# Patient Record
Sex: Male | Born: 1950 | Race: White | Hispanic: No | Marital: Married | Smoking: Former smoker
Health system: Southern US, Community
[De-identification: ages and names within clinical notes are randomized; demographics above are authoritative.]

## PROBLEM LIST (undated history)

## (undated) DIAGNOSIS — T783XXA Angioneurotic edema, initial encounter: Secondary | ICD-10-CM

## (undated) DIAGNOSIS — K219 Gastro-esophageal reflux disease without esophagitis: Secondary | ICD-10-CM

## (undated) DIAGNOSIS — Z86010 Personal history of colonic polyps: Secondary | ICD-10-CM

## (undated) DIAGNOSIS — S149XXA Injury of unspecified nerves of neck, initial encounter: Secondary | ICD-10-CM

## (undated) DIAGNOSIS — G459 Transient cerebral ischemic attack, unspecified: Secondary | ICD-10-CM

## (undated) DIAGNOSIS — E559 Vitamin D deficiency, unspecified: Secondary | ICD-10-CM

## (undated) DIAGNOSIS — G589 Mononeuropathy, unspecified: Secondary | ICD-10-CM

## (undated) DIAGNOSIS — I10 Essential (primary) hypertension: Secondary | ICD-10-CM

## (undated) HISTORY — DX: Angioneurotic edema, initial encounter: T78.3XXA

## (undated) HISTORY — DX: Vitamin D deficiency, unspecified: E55.9

## (undated) HISTORY — DX: Injury of unspecified nerves of neck, initial encounter: S14.9XXA

## (undated) HISTORY — DX: Personal history of colonic polyps: Z86.010

## (undated) HISTORY — PX: WISDOM TOOTH EXTRACTION: SHX21

## (undated) HISTORY — DX: Mononeuropathy, unspecified: G58.9

## (undated) HISTORY — PX: BACK SURGERY: SHX140

## (undated) HISTORY — DX: Transient cerebral ischemic attack, unspecified: G45.9

## (undated) HISTORY — PX: SPINE SURGERY: SHX786

## (undated) HISTORY — PX: TONSILLECTOMY: SUR1361

---

## 1978-03-28 HISTORY — PX: MOUTH SURGERY: SHX715

## 2002-08-10 ENCOUNTER — Emergency Department (HOSPITAL_COMMUNITY): Admission: EM | Admit: 2002-08-10 | Discharge: 2002-08-10 | Payer: Self-pay | Admitting: Emergency Medicine

## 2002-08-10 ENCOUNTER — Encounter: Payer: Self-pay | Admitting: Emergency Medicine

## 2003-09-26 DIAGNOSIS — G459 Transient cerebral ischemic attack, unspecified: Secondary | ICD-10-CM

## 2003-09-26 HISTORY — DX: Transient cerebral ischemic attack, unspecified: G45.9

## 2003-10-20 ENCOUNTER — Observation Stay (HOSPITAL_COMMUNITY): Admission: EM | Admit: 2003-10-20 | Discharge: 2003-10-21 | Payer: Self-pay

## 2010-08-31 DIAGNOSIS — Z86010 Personal history of colonic polyps: Secondary | ICD-10-CM

## 2010-08-31 HISTORY — DX: Personal history of colonic polyps: Z86.010

## 2013-11-11 ENCOUNTER — Other Ambulatory Visit: Payer: Self-pay | Admitting: Family Medicine

## 2013-11-11 DIAGNOSIS — R51 Headache: Secondary | ICD-10-CM

## 2013-11-12 ENCOUNTER — Ambulatory Visit
Admission: RE | Admit: 2013-11-12 | Discharge: 2013-11-12 | Disposition: A | Discharge status: Home / Self Care | Payer: 59 | Source: Ambulatory Visit | Attending: Family Medicine | Admitting: Family Medicine

## 2013-11-12 DIAGNOSIS — R51 Headache: Secondary | ICD-10-CM

## 2013-11-12 MED ORDER — GADOBENATE DIMEGLUMINE 529 MG/ML IV SOLN
20.0000 mL | Freq: Once | INTRAVENOUS | Status: AC | PRN
  Administered 2013-11-12: 20 mL via INTRAVENOUS

## 2015-10-28 ENCOUNTER — Emergency Department (HOSPITAL_BASED_OUTPATIENT_CLINIC_OR_DEPARTMENT_OTHER)
Admission: EM | Admit: 2015-10-28 | Discharge: 2015-10-28 | Disposition: A | Discharge status: Home / Self Care | Payer: Worker's Compensation | Attending: Emergency Medicine | Admitting: Emergency Medicine

## 2015-10-28 ENCOUNTER — Encounter (HOSPITAL_BASED_OUTPATIENT_CLINIC_OR_DEPARTMENT_OTHER): Payer: Self-pay

## 2015-10-28 DIAGNOSIS — Y9389 Activity, other specified: Secondary | ICD-10-CM | POA: Diagnosis not present

## 2015-10-28 DIAGNOSIS — S8012XA Contusion of left lower leg, initial encounter: Secondary | ICD-10-CM | POA: Diagnosis not present

## 2015-10-28 DIAGNOSIS — I10 Essential (primary) hypertension: Secondary | ICD-10-CM | POA: Insufficient documentation

## 2015-10-28 DIAGNOSIS — S80812A Abrasion, left lower leg, initial encounter: Secondary | ICD-10-CM

## 2015-10-28 DIAGNOSIS — Y990 Civilian activity done for income or pay: Secondary | ICD-10-CM | POA: Diagnosis not present

## 2015-10-28 DIAGNOSIS — Z79899 Other long term (current) drug therapy: Secondary | ICD-10-CM | POA: Insufficient documentation

## 2015-10-28 DIAGNOSIS — Z87891 Personal history of nicotine dependence: Secondary | ICD-10-CM | POA: Diagnosis not present

## 2015-10-28 DIAGNOSIS — S00511A Abrasion of lip, initial encounter: Secondary | ICD-10-CM

## 2015-10-28 DIAGNOSIS — Y929 Unspecified place or not applicable: Secondary | ICD-10-CM | POA: Insufficient documentation

## 2015-10-28 DIAGNOSIS — W228XXA Striking against or struck by other objects, initial encounter: Secondary | ICD-10-CM | POA: Insufficient documentation

## 2015-10-28 DIAGNOSIS — S0181XA Laceration without foreign body of other part of head, initial encounter: Secondary | ICD-10-CM | POA: Diagnosis present

## 2015-10-28 HISTORY — DX: Essential (primary) hypertension: I10

## 2015-10-28 HISTORY — DX: Gastro-esophageal reflux disease without esophagitis: K21.9

## 2015-10-28 NOTE — ED Notes (Signed)
Pt verbalizes understanding of d/c instructions and denies any further need at this time. 

## 2015-10-28 NOTE — ED Triage Notes (Addendum)
Pt was trying to get into a truck that was rolling backward and hit his head on the door.  He has a 1 inch, vertical, jagged laceration medial to left eyebrow.  No LOC, pt is not on blood thinners.  Pt also thinks he pulled his right hamstring during episode.

## 2015-10-28 NOTE — ED Provider Notes (Addendum)
[facility]   CSN: [id number] Arrival date & time: 10/28/15  0355  First Provider Contact:  First MD Initiated Contact with Patient 10/28/15 [phone]        History   Chief Complaint Chief Complaint  Patient presents with  . Laceration    HPI XAIVIER Cooper is a 65 y.o. male who struck his head on a door at work this morning just PTA. He was attempting to stop a truck that was rolling backwards. He has a laceration to his mid forehead just to the left of midline. There was no loss of consciousness. Bleeding is been controlled with pressure. There is minimal associated pain. He also has a small abrasion to his upper lip as well as some mild pain in his right posterior thigh, worse with ambulation. He also has a tender, swollen abraded injury to his left shin which he did not notice initially. He states his tetanus is up-to-date.  HPI  Past Medical History:  Diagnosis Date  . GERD (gastroesophageal reflux disease)   . Hypertension     There are no active problems to display for this patient.   Past Surgical History:  Procedure Laterality Date  . BACK SURGERY  2000, 1995     Home Medications    Prior to Admission medications   Medication Sig Start Date End Date Taking? Authorizing Provider  lisinopril (PRINIVIL,ZESTRIL) 40 MG tablet Take 40 mg by mouth daily.   Yes Historical Provider, MD  omeprazole (PRILOSEC) 20 MG capsule Take 20 mg by mouth daily.   Yes Historical Provider, MD    Family History No family history on file.  Social History Social History  Substance Use Topics  . Smoking status: Former Games developer  . Smokeless tobacco: Never Used  . Alcohol use Not on file     Allergies   Codeine   Review of Systems Review of Systems  All other systems reviewed and are negative.   Physical Exam Updated Vital Signs BP (!) 161/101 (BP Location: Left Arm)   Pulse 95   Temp 98.2 F (36.8 C) (Oral)   Resp 18   Ht 6' (1.829 m)   Wt 220  lb (99.8 kg)   SpO2 98%   BMI 29.84 kg/m   Physical Exam General: Well-developed, well-nourished male in no acute distress; appearance consistent with age of record HENT: normocephalic; vertical, somewhat jagged laceration mid forehead superficial abrasion to upper lip; TMs obscured by cerumen bilaterally Eyes: pupils equal, round and reactive to light; extraocular muscles intact Neck: supple; C-spine nontender Heart: regular rate and rhythm Lungs: clear to auscultation bilaterally Abdomen: soft; nondistended; nontender; no masses or hepatosplenomegaly; bowel sounds present Extremities: No deformity; full range of motion; mild tenderness right posterior thigh; hematoma and abrasion to left shin Neurologic: Awake, alert and oriented; motor function intact in all extremities and symmetric; no facial droop Skin: Warm and dry Psychiatric: Normal mood and affect    ED Treatments / Results    Procedures (including critical care time)  LACERATION REPAIR Performed by: , L Authorized by: Hanley Seamen Consent: Verbal consent obtained. Risks and benefits: risks, benefits and alternatives were discussed Consent given by: patient Patient identity confirmed: provided demographic data Prepped and Draped in normal sterile fashion Wound explored  Laceration Location: Mid forehead  Laceration Length: 2.5 cm  No Foreign Bodies seen or palpated  Anesthesia: None   Irrigation method: syringe Amount of cleaning: standard  Skin closure: Skin adhesive   Patient tolerance:  Patient tolerated the procedure well with no immediate complications.   Final Clinical Impressions(s) / ED Diagnoses   Final diagnoses:  Facial laceration, initial encounter  Abrasion of vermilion border of upper lip, initial encounter  Abrasion of anterior left lower leg, initial encounter  Traumatic hematoma of left lower leg, initial encounter      Paula Libra, MD 10/28/15 0423    Paula Libra,  MD 10/28/15 0425    Paula Libra, MD 10/28/15 [phone]

## 2016-07-12 DIAGNOSIS — E559 Vitamin D deficiency, unspecified: Secondary | ICD-10-CM | POA: Diagnosis not present

## 2016-07-12 DIAGNOSIS — S4992XD Unspecified injury of left shoulder and upper arm, subsequent encounter: Secondary | ICD-10-CM | POA: Diagnosis not present

## 2016-07-12 DIAGNOSIS — F419 Anxiety disorder, unspecified: Secondary | ICD-10-CM | POA: Diagnosis not present

## 2016-07-12 DIAGNOSIS — E782 Mixed hyperlipidemia: Secondary | ICD-10-CM | POA: Diagnosis not present

## 2016-07-12 DIAGNOSIS — I10 Essential (primary) hypertension: Secondary | ICD-10-CM | POA: Diagnosis not present

## 2016-07-12 DIAGNOSIS — K219 Gastro-esophageal reflux disease without esophagitis: Secondary | ICD-10-CM | POA: Diagnosis not present

## 2016-08-04 DIAGNOSIS — I10 Essential (primary) hypertension: Secondary | ICD-10-CM | POA: Diagnosis not present

## 2016-08-04 DIAGNOSIS — J209 Acute bronchitis, unspecified: Secondary | ICD-10-CM | POA: Diagnosis not present

## 2016-08-07 ENCOUNTER — Encounter (HOSPITAL_COMMUNITY): Payer: Self-pay | Admitting: Emergency Medicine

## 2016-08-07 ENCOUNTER — Emergency Department (HOSPITAL_COMMUNITY): Payer: PPO

## 2016-08-07 ENCOUNTER — Emergency Department (HOSPITAL_COMMUNITY)
Admission: EM | Admit: 2016-08-07 | Discharge: 2016-08-07 | Disposition: A | Discharge status: Home / Self Care | Payer: PPO | Attending: Emergency Medicine | Admitting: Emergency Medicine

## 2016-08-07 DIAGNOSIS — R918 Other nonspecific abnormal finding of lung field: Secondary | ICD-10-CM | POA: Diagnosis not present

## 2016-08-07 DIAGNOSIS — R1030 Lower abdominal pain, unspecified: Secondary | ICD-10-CM | POA: Insufficient documentation

## 2016-08-07 DIAGNOSIS — R3915 Urgency of urination: Secondary | ICD-10-CM | POA: Diagnosis not present

## 2016-08-07 DIAGNOSIS — R109 Unspecified abdominal pain: Secondary | ICD-10-CM | POA: Diagnosis not present

## 2016-08-07 DIAGNOSIS — Z79899 Other long term (current) drug therapy: Secondary | ICD-10-CM | POA: Diagnosis not present

## 2016-08-07 DIAGNOSIS — Z87891 Personal history of nicotine dependence: Secondary | ICD-10-CM | POA: Diagnosis not present

## 2016-08-07 DIAGNOSIS — I10 Essential (primary) hypertension: Secondary | ICD-10-CM | POA: Diagnosis not present

## 2016-08-07 DIAGNOSIS — R350 Frequency of micturition: Secondary | ICD-10-CM | POA: Insufficient documentation

## 2016-08-07 DIAGNOSIS — R300 Dysuria: Secondary | ICD-10-CM

## 2016-08-07 DIAGNOSIS — R911 Solitary pulmonary nodule: Secondary | ICD-10-CM | POA: Diagnosis not present

## 2016-08-07 LAB — CBC WITH DIFFERENTIAL/PLATELET
BASOS PCT: 0 %
Basophils Absolute: 0 10*3/uL (ref 0.0–0.1)
EOS PCT: 5 %
Eosinophils Absolute: 0.6 10*3/uL (ref 0.0–0.7)
HEMATOCRIT: 42.4 % (ref 39.0–52.0)
Hemoglobin: 14.9 g/dL (ref 13.0–17.0)
Lymphocytes Relative: 14 %
Lymphs Abs: 1.7 10*3/uL (ref 0.7–4.0)
MCH: 31.7 pg (ref 26.0–34.0)
MCHC: 35.1 g/dL (ref 30.0–36.0)
MCV: 90.2 fL (ref 78.0–100.0)
Monocytes Absolute: 1.8 10*3/uL — ABNORMAL HIGH (ref 0.1–1.0)
Monocytes Relative: 16 %
NEUTROS ABS: 7.7 10*3/uL (ref 1.7–7.7)
NEUTROS PCT: 65 %
PLATELETS: 272 10*3/uL (ref 150–400)
RBC: 4.7 MIL/uL (ref 4.22–5.81)
RDW: 12.7 % (ref 11.5–15.5)
WBC: 11.7 10*3/uL — AB (ref 4.0–10.5)

## 2016-08-07 LAB — URINALYSIS, ROUTINE W REFLEX MICROSCOPIC
Bilirubin Urine: NEGATIVE
Glucose, UA: NEGATIVE mg/dL
Hgb urine dipstick: NEGATIVE
KETONES UR: NEGATIVE mg/dL
NITRITE: NEGATIVE
PH: 6 (ref 5.0–8.0)
Protein, ur: NEGATIVE mg/dL
SQUAMOUS EPITHELIAL / LPF: NONE SEEN
Specific Gravity, Urine: 1.009 (ref 1.005–1.030)

## 2016-08-07 LAB — BASIC METABOLIC PANEL
ANION GAP: 10 (ref 5–15)
BUN: 24 mg/dL — ABNORMAL HIGH (ref 6–20)
CALCIUM: 9 mg/dL (ref 8.9–10.3)
CO2: 22 mmol/L (ref 22–32)
CREATININE: 1.06 mg/dL (ref 0.61–1.24)
Chloride: 104 mmol/L (ref 101–111)
Glucose, Bld: 99 mg/dL (ref 65–99)
Potassium: 3.7 mmol/L (ref 3.5–5.1)
SODIUM: 136 mmol/L (ref 135–145)

## 2016-08-07 MED ORDER — ONDANSETRON HCL 4 MG/2ML IJ SOLN
4.0000 mg | Freq: Once | INTRAMUSCULAR | Status: AC
  Administered 2016-08-07: 4 mg via INTRAVENOUS
  Filled 2016-08-07: qty 2

## 2016-08-07 MED ORDER — PHENAZOPYRIDINE HCL 200 MG PO TABS: 200.0000 mg | ORAL_TABLET | Freq: Three times a day (TID) | ORAL | 0 refills | Status: DC | PRN

## 2016-08-07 MED ORDER — CIPROFLOXACIN HCL 500 MG PO TABS: 500.0000 mg | ORAL_TABLET | Freq: Two times a day (BID) | ORAL | 0 refills | Status: DC

## 2016-08-07 MED ORDER — DIAZEPAM 5 MG PO TABS: 5.0000 mg | ORAL_TABLET | Freq: Two times a day (BID) | ORAL | 0 refills | Status: DC

## 2016-08-07 MED ORDER — DIAZEPAM 5 MG/ML IJ SOLN
5.0000 mg | Freq: Once | INTRAMUSCULAR | Status: AC
  Administered 2016-08-07: 5 mg via INTRAVENOUS
  Filled 2016-08-07: qty 2

## 2016-08-07 MED ORDER — DEXTROSE 5 % IV SOLN
1.0000 g | Freq: Once | INTRAVENOUS | Status: AC
  Administered 2016-08-07: 1 g via INTRAVENOUS
  Filled 2016-08-07: qty 10

## 2016-08-07 MED ORDER — MORPHINE SULFATE (PF) 4 MG/ML IV SOLN
4.0000 mg | Freq: Once | INTRAVENOUS | Status: AC
  Administered 2016-08-07: 4 mg via INTRAVENOUS
  Filled 2016-08-07: qty 1

## 2016-08-07 MED ORDER — SODIUM CHLORIDE 0.9 % IV BOLUS (SEPSIS)
1000.0000 mL | Freq: Once | INTRAVENOUS | Status: AC
  Administered 2016-08-07: 1000 mL via INTRAVENOUS

## 2016-08-07 NOTE — ED Provider Notes (Signed)
[facility]-EMER[facility]ENCY DEPT Provider Note   CSN: [id number] Arrival date & time: 08/07/16  1256     History   Chief Complaint Chief Complaint  Patient presents with  . Urinary Retention    HPI Craig Cooper is a 66 y.o. male.  HPI 66 year old Caucasian male past medical history significant for GERD and hypertension presents to the emergency Department today with complaints of bladder pressure, urgency, frequency, urinary retention for the past 24 hours. The patient states that his last normal void was 24 hours ago. He only dribbles now. He complains of bladder pressure and pain at the end of his penis when urinating. States that there is urgency and frequency. Patient is concerned that he started taking Zithromax and is concerned for a side effect of the drug. Patient spoke with his primary care doctor today who sent him to the emergency department for evaluation. Patient states he started taking Zithromax 4 days ago for bronchitis. Patient states she did have a fever yesterday that was treated with Tylenol and has not had a fever since. Denies any history of the same pain. Patient denies any flank pain. Denies any hematuria. Reports mild nausea and emesis that he relates to taking the Zithromax. Denies any chills, headache, chest pain, shortness of breath, change in bowel habits. Past Medical History:  Diagnosis Date  . GERD (gastroesophageal reflux disease)   . Hypertension     There are no active problems to display for this patient.   Past Surgical History:  Procedure Laterality Date  . BACK SURGERY  2000, 1995  . TONSILLECTOMY    . WISDOM TOOTH EXTRACTION         Home Medications    Prior to Admission medications   Medication Sig Start Date End Date Taking? Authorizing Provider  lisinopril (PRINIVIL,ZESTRIL) 40 MG tablet Take 40 mg by mouth daily.    [provider]  omeprazole (PRILOSEC) 20 MG capsule Take 20 mg by mouth daily.    [provider]     Family History Family History  Problem Relation Age of Onset  . Hypertension Mother   . Hypertension Father     Social History Social History  Substance Use Topics  . Smoking status: Former Games developer  . Smokeless tobacco: Never Used  . Alcohol use Yes     Comment: occ     Allergies   Codeine   Review of Systems Review of Systems  Constitutional: Positive for fever (resolved yesterday). Negative for chills.  Eyes: Negative for visual disturbance.  Respiratory: Negative for cough and shortness of breath.   Cardiovascular: Negative for chest pain.  Gastrointestinal: Positive for abdominal pain (suprapubic), nausea and vomiting. Negative for diarrhea.  Genitourinary: Positive for dysuria, frequency, penile pain and urgency. Negative for discharge, flank pain, hematuria, scrotal swelling and testicular pain.  Skin: Negative.   Neurological: Negative for dizziness, syncope, weakness, light-headedness and headaches.     Physical Exam Updated Vital Signs BP 131/80   Pulse 70   Temp 97.6 F (36.4 C) (Oral)   Resp 16   Wt 97.5 kg   SpO2 100%   BMI 29.16 kg/m   Physical Exam  Constitutional: He is oriented to person, place, and time. He appears well-developed and well-nourished. No distress.  The patient is pacing in the room and appears uncomfortable due to pain.  HENT:  Head: Normocephalic and atraumatic.  Mouth/Throat: Oropharynx is clear and moist.  Eyes: Conjunctivae are normal. Right eye exhibits no discharge. Left eye exhibits  no discharge. No scleral icterus.  Neck: Normal range of motion. Neck supple. No thyromegaly present.  Cardiovascular: Normal rate, regular rhythm, normal heart sounds and intact distal pulses.   Pulmonary/Chest: Effort normal and breath sounds normal.  Abdominal: Soft. Bowel sounds are normal. He exhibits no distension. There is tenderness in the suprapubic area. There is no rigidity, no rebound, no guarding and no CVA tenderness.   Genitourinary: Testes normal. Right testis shows no mass, no swelling and no tenderness. Left testis shows no mass, no swelling and no tenderness. Circumcised. No penile erythema or penile tenderness. No discharge found.  Musculoskeletal: Normal range of motion.  Lymphadenopathy:    He has no cervical adenopathy.  Neurological: He is alert and oriented to person, place, and time.  Skin: Skin is warm and dry. Capillary refill takes less than 2 seconds.  Nursing note and vitals reviewed.    ED Treatments / Results  Labs (all labs ordered are listed, but only abnormal results are displayed) Labs Reviewed  URINALYSIS, ROUTINE W REFLEX MICROSCOPIC - Abnormal; Notable for the following:       Result Value   Leukocytes, UA MODERATE (*)    Bacteria, UA FEW (*)    All other components within normal limits  BASIC METABOLIC PANEL - Abnormal; Notable for the following:    BUN 24 (*)    All other components within normal limits  CBC WITH DIFFERENTIAL/PLATELET - Abnormal; Notable for the following:    WBC 11.7 (*)    Monocytes Absolute 1.8 (*)    All other components within normal limits  URINE CULTURE    EKG  EKG Interpretation None       Radiology Ct Renal Stone Study  Result Date: 08/07/2016 CLINICAL DATA:  66 year old male with abdominal and pelvic pain with bladder pressure for 1 day. EXAM: CT ABDOMEN AND PELVIS WITHOUT CONTRAST TECHNIQUE: Multidetector CT imaging of the abdomen and pelvis was performed following the standard protocol without IV contrast. COMPARISON:  04/11/2014 lumbar spine MR FINDINGS: Please note that parenchymal abnormalities may be missed without intravenous contrast. Lower chest: No acute abnormality. A 3 mm right middle lobe nodule (image 5) and a 3 mm left lower lobe nodule (image 16) are noted. Hepatobiliary: The liver and gallbladder are unremarkable except for hepatic cysts. There is no evidence of biliary dilatation. Pancreas: Unremarkable Spleen:  Unremarkable Adrenals/Urinary Tract: Fullness of the left intrarenal collecting system and mild perinephric stranding are unchanged. There is no evidence of hydronephrosis or urinary calculi. A 1.5 x 2 cm right adrenal adenoma is noted. The left adrenal gland and bladder are unremarkable. Stomach/Bowel: Sigmoid colonic diverticulosis noted without evidence of diverticulitis. There is no evidence of bowel wall thickening or bowel obstruction. Vascular/Lymphatic: Aortic atherosclerotic calcifications noted without aneurysm. No enlarged lymph nodes identified. Reproductive: The seminal vesicles are prominent with mild adjacent stranding of uncertain chronicity. Prostate is within normal limits. Other: Moderate bilateral inguinal hernias containing fat are noted. There is no evidence of ascites, pneumoperitoneum or focal collection. Musculoskeletal: No acute abnormality or suspicious focal lesions. Interbody fusion cages at L4-L5 noted. Moderate degenerative disc disease, spondylosis and facet arthropathy within the remainder of the lumbar spine identified. IMPRESSION: Prominent seminal vesicles with mild adjacent stranding of uncertain chronicity. This may represent seminal vesiculitis -correlate clinically. No evidence of obstructing urinary calculi or hydronephrosis. Fullness of the intrarenal collecting systems appear unchanged from 2016. Abdominal aortic atherosclerosis. Moderate bilateral inguinal hernias containing fat. 2 cm right adrenal adenoma. 3 mm right  middle lobe and 3 mm left lower lobe pulmonary nodules. No follow-up needed if patient is low-risk (and has no known or suspected primary neoplasm). Non-contrast chest CT can be considered in 12 months if patient is high-risk. This recommendation follows the consensus statement: Guidelines for Management of Incidental Pulmonary Nodules Detected on CT Images: From the Fleischner Society 2017; Radiology 2017; 284:228-243. Electronically Signed   By: Harmon PierJeffrey  Hu  M.D.   On: 08/07/2016 15:31    Procedures Procedures (including critical care time)  Medications Ordered in ED Medications  morphine 4 MG/ML injection 4 mg (4 mg Intravenous Given 08/07/16 1458)  ondansetron (ZOFRAN) injection 4 mg (4 mg Intravenous Given 08/07/16 1458)  cefTRIAXone (ROCEPHIN) 1 g in dextrose 5 % 50 mL IVPB (0 g Intravenous Stopped 08/07/16 1527)  sodium chloride 0.9 % bolus 1,000 mL (0 mLs Intravenous Stopped 08/07/16 1637)  diazepam (VALIUM) injection 5 mg (5 mg Intravenous Given 08/07/16 1527)     Initial Impression / Assessment and Plan / ED Course  I have reviewed the triage vital signs and the nursing notes.  Pertinent labs & imaging results that were available during my care of the patient were reviewed by me and considered in my medical decision making (see chart for details).     Patient presents to the emergency Department with complaints of urinary urgency, frequency, dysuria, bladder pressure. The patient has had little urine output over the past 24 hours. Initially started on Zithromax for bronchitis. Bladder scan shows 50 ML's of urine in the bladder doubt acute urinary retention. UA shows moderate leukocytes, too numerous to count WBCs and few bacteria. Has a mild leukocytosis of 11,000. Patient is afebrile. Heart rate is normal and no hypotension. Patient does not meet SIRS or Sepsis criteria.  mild tenderness of the suprapubic region. The creatinine is normal. Electrolytes are normal. Given patient's tenderness with UA and signs of infection will order CT scan to rule out stone. CT scan does show Prominent seminal vesicles with mild adjacent stranding of uncertain chronicity, this may represent seminal vesiculitis. Prostate appears to be normal. Bladder is nondistended or inflamed. Unsure if patient's symptoms are coming from seminal vesiculitis, prostatitis, cystitis. We'll send urine for culture. Will start patient on antibiotics. Was given IV Rocephin in the ED.  We'll discharge with 14 days of Cipro at home. Given Pyridium and Valium for bladder spasms. Patient will need close follow-up with urology. Patient was seen and evaluated by Dr.  Silverio LayYao who is agreeable to above plan and no further recommendations time. Patient was updated on CT findings and given strict return precautions. Pt is hemodynamically stable, in NAD, & able to ambulate in the ED. Pain has been managed & has no complaints prior to dc. Pt is comfortable with above plan and is stable for discharge at this time. All questions were answered prior to disposition. Strict return precautions for f/u to the ED were discussed.   Final Clinical Impressions(s) / ED Diagnoses   Final diagnoses:  Pulmonary nodule  Urinary urgency  Urinary frequency  Dysuria    New Prescriptions Discharge Medication List as of 08/07/2016  4:07 PM    START taking these medications   Details  ciprofloxacin (CIPRO) 500 MG tablet Take 1 tablet (500 mg total) by mouth 2 (two) times daily., Starting Sun 08/07/2016, Print    diazepam (VALIUM) 5 MG tablet Take 1 tablet (5 mg total) by mouth 2 (two) times daily., Starting Sun 08/07/2016, Print    phenazopyridine (PYRIDIUM)  200 MG tablet Take 1 tablet (200 mg total) by mouth 3 (three) times daily as needed for pain. Do not take for more than 2 days while taking antibiotic., Starting Sun 08/07/2016, Print         Rise Mu, PA-C 08/07/16 2301    Charlynne Pander, MD 08/09/16 [phone]

## 2016-08-07 NOTE — Discharge Instructions (Signed)
Have discussed her CT findings with view. There are this is a infection in her urine. Please have antibiotics as prescribed for 14 days. May use the Pyridium as needed for no more than 2 days while taking antibiotic. Take the Valium as needed for muscle relaxation. Warm soaks in Epsom salt. Make sure to follow up withurology. Return to the ED if you develop worsening pain, fevers, worsening vomiting or for any reason.

## 2016-08-07 NOTE — ED Triage Notes (Signed)
Pt reports bladder pressure and pain x 24 hours.Last normal void over 24 hours ago. Referred to ED by PCP. Pt. has been taking Zithromax and is concerned about urinary retention being being a side effect. Current void 100 cc clear yellow urine. Pt alert, oriented and ambulatory. Family at bedside

## 2016-08-10 DIAGNOSIS — N401 Enlarged prostate with lower urinary tract symptoms: Secondary | ICD-10-CM | POA: Diagnosis not present

## 2016-08-10 DIAGNOSIS — N410 Acute prostatitis: Secondary | ICD-10-CM | POA: Diagnosis not present

## 2016-09-21 DIAGNOSIS — N401 Enlarged prostate with lower urinary tract symptoms: Secondary | ICD-10-CM | POA: Diagnosis not present

## 2016-09-30 ENCOUNTER — Other Ambulatory Visit: Payer: Self-pay | Admitting: *Deleted

## 2016-09-30 NOTE — Patient Outreach (Addendum)
[facility] Care Management  09/30/2016  Craig Cooper 04/23/1950 [id number]   Member identified as high risk according to [employer] health questionnaire.  Call placed to introduce [facility] services and perform telephone screening.  No answer, HIPAA compliant voice message left.  Will await call back, if no call back, will follow up within the next week.    Update @ 1315:  Call received back from member.  Identity verified.  [facility] explained.  He report that he is independent in managing his care, confirms that he attends office visits with his primary MD on a regular basis.  Report his hypertension medication was changed from Lisinopril due to side effects, but is now controlled with new medication.  He denies any concern regarding affordability of meds, but he does express interest about obtaining a new blood pressure monitor.  He state he has one that is several years old and need to be updated.  He is advised that this care manager will look into resources for new monitor and will follow up next week.  He is open to receive information regarding [facility] services for future purposes, declines involvement at this time.   Kemper DurieMonica , [place]RN, MSN [facility] Care [facility]  [facility] Care Manager [phone]

## 2016-10-03 ENCOUNTER — Other Ambulatory Visit: Payer: Self-pay | Admitting: *Deleted

## 2016-10-03 NOTE — Patient Outreach (Signed)
[facility] Care Management  10/03/2016  Lorra HalsVirgil Wayne Nehme 02/28/1951 [id number]   Discussed member's concern of needing updated blood pressure monitor with assistant clinical director, permission granted to provide new monitor.  Call placed to member to inform him that [facility] and mail new blood pressure monitor to him at home.  He expresses gratitude.  Will not open or place referral at this time as member has no further concerns.  Kemper DurieMonica , [place]RN, MSN [facility] Care Management  [facility] Care Manager [phone]

## 2016-10-06 ENCOUNTER — Other Ambulatory Visit: Payer: Self-pay | Admitting: *Deleted

## 2016-10-21 DIAGNOSIS — S86011A Strain of right Achilles tendon, initial encounter: Secondary | ICD-10-CM | POA: Diagnosis not present

## 2016-12-13 DIAGNOSIS — Z23 Encounter for immunization: Secondary | ICD-10-CM | POA: Diagnosis not present

## 2016-12-14 DIAGNOSIS — N401 Enlarged prostate with lower urinary tract symptoms: Secondary | ICD-10-CM | POA: Diagnosis not present

## 2016-12-14 DIAGNOSIS — R3912 Poor urinary stream: Secondary | ICD-10-CM | POA: Diagnosis not present

## 2017-01-23 DIAGNOSIS — I10 Essential (primary) hypertension: Secondary | ICD-10-CM | POA: Diagnosis not present

## 2017-01-23 DIAGNOSIS — E782 Mixed hyperlipidemia: Secondary | ICD-10-CM | POA: Diagnosis not present

## 2017-01-23 DIAGNOSIS — Z79899 Other long term (current) drug therapy: Secondary | ICD-10-CM | POA: Diagnosis not present

## 2017-01-23 DIAGNOSIS — M158 Other polyosteoarthritis: Secondary | ICD-10-CM | POA: Diagnosis not present

## 2017-01-23 DIAGNOSIS — E559 Vitamin D deficiency, unspecified: Secondary | ICD-10-CM | POA: Diagnosis not present

## 2017-01-23 DIAGNOSIS — Z0000 Encounter for general adult medical examination without abnormal findings: Secondary | ICD-10-CM | POA: Diagnosis not present

## 2017-01-23 DIAGNOSIS — Z1159 Encounter for screening for other viral diseases: Secondary | ICD-10-CM | POA: Diagnosis not present

## 2017-01-23 DIAGNOSIS — K219 Gastro-esophageal reflux disease without esophagitis: Secondary | ICD-10-CM | POA: Diagnosis not present

## 2017-01-23 DIAGNOSIS — N400 Enlarged prostate without lower urinary tract symptoms: Secondary | ICD-10-CM | POA: Diagnosis not present

## 2017-05-12 DIAGNOSIS — Z23 Encounter for immunization: Secondary | ICD-10-CM | POA: Diagnosis not present

## 2017-06-28 DIAGNOSIS — H16223 Keratoconjunctivitis sicca, not specified as Sjogren's, bilateral: Secondary | ICD-10-CM | POA: Diagnosis not present

## 2017-07-31 DIAGNOSIS — K219 Gastro-esophageal reflux disease without esophagitis: Secondary | ICD-10-CM | POA: Diagnosis not present

## 2017-07-31 DIAGNOSIS — E559 Vitamin D deficiency, unspecified: Secondary | ICD-10-CM | POA: Diagnosis not present

## 2017-07-31 DIAGNOSIS — Z6832 Body mass index (BMI) 32.0-32.9, adult: Secondary | ICD-10-CM | POA: Diagnosis not present

## 2017-07-31 DIAGNOSIS — I10 Essential (primary) hypertension: Secondary | ICD-10-CM | POA: Diagnosis not present

## 2017-07-31 DIAGNOSIS — N400 Enlarged prostate without lower urinary tract symptoms: Secondary | ICD-10-CM | POA: Diagnosis not present

## 2017-07-31 DIAGNOSIS — F419 Anxiety disorder, unspecified: Secondary | ICD-10-CM | POA: Diagnosis not present

## 2017-07-31 DIAGNOSIS — M158 Other polyosteoarthritis: Secondary | ICD-10-CM | POA: Diagnosis not present

## 2017-07-31 DIAGNOSIS — E782 Mixed hyperlipidemia: Secondary | ICD-10-CM | POA: Diagnosis not present

## 2017-08-11 DIAGNOSIS — I10 Essential (primary) hypertension: Secondary | ICD-10-CM | POA: Diagnosis not present

## 2017-08-11 DIAGNOSIS — T783XXA Angioneurotic edema, initial encounter: Secondary | ICD-10-CM | POA: Diagnosis not present

## 2017-08-18 DIAGNOSIS — I10 Essential (primary) hypertension: Secondary | ICD-10-CM | POA: Diagnosis not present

## 2017-08-18 DIAGNOSIS — T783XXD Angioneurotic edema, subsequent encounter: Secondary | ICD-10-CM | POA: Diagnosis not present

## 2017-09-18 DIAGNOSIS — K219 Gastro-esophageal reflux disease without esophagitis: Secondary | ICD-10-CM | POA: Diagnosis not present

## 2017-09-18 DIAGNOSIS — I10 Essential (primary) hypertension: Secondary | ICD-10-CM | POA: Diagnosis not present

## 2017-09-18 DIAGNOSIS — T783XXD Angioneurotic edema, subsequent encounter: Secondary | ICD-10-CM | POA: Diagnosis not present

## 2017-10-13 ENCOUNTER — Ambulatory Visit: Payer: Self-pay | Admitting: Allergy and Immunology

## 2017-10-24 ENCOUNTER — Ambulatory Visit: Payer: PPO | Admitting: Allergy and Immunology

## 2017-10-24 ENCOUNTER — Encounter: Payer: Self-pay | Admitting: Allergy and Immunology

## 2017-10-24 VITALS — BP 122/88 | HR 61 | Temp 98.1°F | Resp 20 | Ht 71.06 in | Wt 230.4 lb

## 2017-10-24 DIAGNOSIS — T783XXA Angioneurotic edema, initial encounter: Secondary | ICD-10-CM | POA: Insufficient documentation

## 2017-10-24 DIAGNOSIS — J310 Chronic rhinitis: Secondary | ICD-10-CM

## 2017-10-24 DIAGNOSIS — T7840XD Allergy, unspecified, subsequent encounter: Secondary | ICD-10-CM

## 2017-10-24 DIAGNOSIS — T783XXD Angioneurotic edema, subsequent encounter: Secondary | ICD-10-CM

## 2017-10-24 MED ORDER — FLUTICASONE PROPIONATE 50 MCG/ACT NA SUSP: NASAL | 5 refills | Status: DC

## 2017-10-24 NOTE — Progress Notes (Addendum)
New Patient Note  RE: Craig Cooper MRN: [id number] DOB: 1950/05/14 Date of Office Visit: 10/24/2017  Referring provider: Gweneth Dimitri, MD Primary care provider: Gweneth Dimitri, MD  Chief Complaint: Angioedema   History of present illness: Craig Cooper is a 67 y.o. male seen today in consultation requested by Gweneth Dimitri, MD.  He complains of episodes of swelling of the lips, tongue, and buccal mucosa.  In mid May 2019 he developed swelling of the tongue.  He reports that losartan was stopped at that time.  He had previously been on lisinopril, however discontinued this medication several months prior due to a persistent cough which improved after discontinuing the ACE inhibitor.  On June 20 he once again developed swelling of the tongue and on June 24 metoprolol and tamsulosin were discontinued.  However, since that time he has had several episodes of swelling, few times there was mild swelling of the buccal mucosa, on July 18 he had mild swelling of the upper lip and 4 days ago he once again had mild swelling of the upper lip.  He has no family history of angioedema.  He does have a history of tick bites and consumes mammalian meat.  He experiences mild nasal congestion, rhinorrhea, and sneezing, particularly with pollen exposure.  These nasal symptoms are controlled with cetirizine.  Assessment and plan: Angioedema Unclear etiology.  Food allergen skin testing was negative today despite a positive histamine control.  The patient is not taking an ACE inhibitor.  There are no concomitant symptoms concerning for anaphylaxis or constitutional symptoms worrisome for an underlying malignancy.  Will order labs to rule out hereditary angioedema, acquired angioedema, urticaria associated angioedema, and other potential etiologies.   The following labs have been ordered:  tryptase, C4, C1 esterase inhibitor (quantitative and functional), C1q, CBC, CMP, and galactose-alpha-1,3-galactose  IgE level.  The patient will be notified with further recommendations after lab results have returned.  IShould symptoms recur, a  journal is to be kept recording any foods eaten, beverages consumed, medications taken, activities performed, and environmental conditions within a 6 hour period prior to the onset of symptoms. For any symptoms concerning for anaphylaxis, 911 is to be called immediately.  Rhinitis Environmental skin tests were negative to seasonal and perennial aeroallergens.  A prescription has been provided for fluticasone nasal spray, one spray per nostril 1-2 times daily as needed. Proper nasal spray technique has been discussed and demonstrated.  Nasal saline spray (i.e., Simply Saline) or nasal saline lavage (i.e., NeilMed) is recommended as needed and prior to medicated nasal sprays.   Meds ordered this encounter  Medications  . fluticasone (FLONASE) 50 MCG/ACT nasal spray    Sig: 1 spray per nostril 1-2 times a day    Dispense:  16 g    Refill:  5    Diagnostics: Environmental skin testing: Negative despite a positive histamine control. Food allergen skin testing: Negative despite a positive histamine control.    Physical examination: Blood pressure 122/88, pulse 61, temperature 98.1 F (36.7 C), temperature source Oral, resp. rate 20, height 5' 11.06" (1.805 m), weight 230 lb 6.4 oz (104.5 kg), SpO2 97 %.  General: Alert, interactive, in no acute distress. HEENT: TMs pearly gray, turbinates moderately edematous without discharge, post-pharynx unremarkable. Neck: Supple without lymphadenopathy. Lungs: Clear to auscultation without wheezing, rhonchi or rales. CV: Normal S1, S2 without murmurs. Abdomen: Nondistended, nontender. Skin: Warm and dry, without lesions or rashes. Extremities:  No clubbing, cyanosis or edema. Neuro:  Grossly intact.  Review of systems:  Review of systems negative except as noted in HPI / PMHx or noted below: Review of Systems    Constitutional: Negative.   HENT: Negative.   Eyes: Negative.   Respiratory: Negative.   Cardiovascular: Negative.   Gastrointestinal: Negative.   Genitourinary: Negative.   Musculoskeletal: Negative.   Skin: Negative.   Neurological: Negative.   Endo/Heme/Allergies: Negative.   Psychiatric/Behavioral: Negative.     Past medical history:  Past Medical History:  Diagnosis Date  . Angio-edema   . GERD (gastroesophageal reflux disease)   . Hypertension     Past surgical history:  Past Surgical History:  Procedure Laterality Date  . BACK SURGERY  2000, 1995  . MOUTH SURGERY  1980  . SPINE SURGERY  1992/1998  . TONSILLECTOMY    . WISDOM TOOTH EXTRACTION      Family history: Family History  Problem Relation Age of Onset  . Hypertension Mother   . Hypertension Father     Social history: Social History   Socioeconomic History  . Marital status: Married    Spouse name: Not on file  . Number of children: Not on file  . Years of education: Not on file  . Highest education level: Not on file  Occupational History  . Not on file  Social Needs  . Financial resource strain: Not on file  . Food insecurity:    Worry: Not on file    Inability: Not on file  . Transportation needs:    Medical: Not on file    Non-medical: Not on file  Tobacco Use  . Smoking status: Former Games developer  . Smokeless tobacco: Never Used  Substance and Sexual Activity  . Alcohol use: Yes    Comment: occ  . Drug use: Not on file  . Sexual activity: Not on file  Lifestyle  . Physical activity:    Days per week: Not on file    Minutes per session: Not on file  . Stress: Not on file  Relationships  . Social connections:    Talks on phone: Not on file    Gets together: Not on file    Attends religious service: Not on file    Active member of club or organization: Not on file    Attends meetings of clubs or organizations: Not on file    Relationship status: Not on file  . Intimate partner  violence:    Fear of current or ex partner: Not on file    Emotionally abused: Not on file    Physically abused: Not on file    Forced sexual activity: Not on file  Other Topics Concern  . Not on file  Social History Narrative  . Not on file   Environmental History: The patient lives in a 36-year-old townhouse with laminate floors throughout, gas heat, and central air.  There is no known mold/water damage in the home.  There are no pets in the home.  He has a remote history of cigarette smoking having smoked for 1-1/2 years in the mid 33s.  Allergies as of 10/24/2017      Reactions   Cholesterol    Allergic to cholesterol meds   Codeine Nausea Only   Lisinopril Cough   Losartan Potassium Swelling   Vicodin [hydrocodone-acetaminophen] Other (See Comments)   Upset stomach      Medication List        Accurate as of 10/24/17  9:44 PM. Always use your most recent  med list.          aspirin 325 MG tablet Take by mouth.   bisoprolol 5 MG tablet Commonly known as:  ZEBETA TAKE 1 TO 2 TABLETS BY MOUTH ONCE DAILY   fluticasone 50 MCG/ACT nasal spray Commonly known as:  FLONASE 1 spray per nostril 1-2 times a day   metoprolol succinate 50 MG 24 hr tablet Commonly known as:  TOPROL-XL Take 50 mg by mouth daily.   omeprazole 20 MG capsule Commonly known as:  PRILOSEC Take by mouth.       Known medication allergies: Allergies  Allergen Reactions  . Cholesterol     Allergic to cholesterol meds  . Codeine Nausea Only  . Lisinopril Cough  . Losartan Potassium Swelling  . Vicodin [Hydrocodone-Acetaminophen] Other (See Comments)    Upset stomach    I appreciate the opportunity to take part in Chloe's care. Please do not hesitate to contact me with questions.  Sincerely,   R. Jorene Guestarter , MD  ------------------------------  Addendum 11/01/17  No laboratory results point to an underlying etiology.  The patient has been notified and instructed that should  significant or new symptoms occur, a journal is to be kept recording any foods eaten, beverages consumed, medications taken, activities performed, and environmental conditions within a 6 hour time period prior to the onset of symptoms. For any symptoms concerning for anaphylaxis, 911 is to be called immediately.

## 2017-10-24 NOTE — Assessment & Plan Note (Signed)
Environmental skin tests were negative to seasonal and perennial aeroallergens.  A prescription has been provided for fluticasone nasal spray, one spray per nostril 1-2 times daily as needed. Proper nasal spray technique has been discussed and demonstrated.  Nasal saline spray (i.e., Simply Saline) or nasal saline lavage (i.e., NeilMed) is recommended as needed and prior to medicated nasal sprays.

## 2017-10-24 NOTE — Patient Instructions (Addendum)
Angioedema Unclear etiology.  Food allergen skin testing was negative today despite a positive histamine control.  The patient is not taking an ACE inhibitor.  There are no concomitant symptoms concerning for anaphylaxis or constitutional symptoms worrisome for an underlying malignancy.  Will order labs to rule out hereditary angioedema, acquired angioedema, urticaria associated angioedema, and other potential etiologies.   The following labs have been ordered:  tryptase, C4, C1 esterase inhibitor (quantitative and functional), C1q, CBC, CMP, and galactose-alpha-1,3-galactose IgE level.  The patient will be notified with further recommendations after lab results have returned.  IShould symptoms recur, a  journal is to be kept recording any foods eaten, beverages consumed, medications taken, activities performed, and environmental conditions within a 6 hour period prior to the onset of symptoms. For any symptoms concerning for anaphylaxis, 911 is to be called immediately.  Rhinitis Environmental skin tests were negative to seasonal and perennial aeroallergens.  A prescription has been provided for fluticasone nasal spray, one spray per nostril 1-2 times daily as needed. Proper nasal spray technique has been discussed and demonstrated.  Nasal saline spray (i.e., Simply Saline) or nasal saline lavage (i.e., NeilMed) is recommended as needed and prior to medicated nasal sprays.   When lab results have returned the patient will be called with further recommendations and follow up instructions.

## 2017-10-24 NOTE — Assessment & Plan Note (Signed)
Unclear etiology.  Food allergen skin testing was negative today despite a positive histamine control.  The patient is not taking an ACE inhibitor.  There are no concomitant symptoms concerning for anaphylaxis or constitutional symptoms worrisome for an underlying malignancy.  Will order labs to rule out hereditary angioedema, acquired angioedema, urticaria associated angioedema, and other potential etiologies.   The following labs have been ordered:  tryptase, C4, C1 esterase inhibitor (quantitative and functional), C1q, CBC, CMP, and galactose-alpha-1,3-galactose IgE level.  The patient will be notified with further recommendations after lab results have returned.  IShould symptoms recur, a  journal is to be kept recording any foods eaten, beverages consumed, medications taken, activities performed, and environmental conditions within a 6 hour period prior to the onset of symptoms. For any symptoms concerning for anaphylaxis, 911 is to be called immediately.

## 2017-11-01 LAB — COMPREHENSIVE METABOLIC PANEL
ALT: 19 IU/L (ref 0–44)
AST: 21 IU/L (ref 0–40)
Albumin/Globulin Ratio: 1.7 (ref 1.2–2.2)
Albumin: 4.3 g/dL (ref 3.6–4.8)
Alkaline Phosphatase: 52 IU/L (ref 39–117)
BUN/Creatinine Ratio: 20 (ref 10–24)
BUN: 18 mg/dL (ref 8–27)
Bilirubin Total: 0.4 mg/dL (ref 0.0–1.2)
CO2: 24 mmol/L (ref 20–29)
Calcium: 9.3 mg/dL (ref 8.6–10.2)
Chloride: 103 mmol/L (ref 96–106)
Creatinine, Ser: 0.9 mg/dL (ref 0.76–1.27)
GFR calc Af Amer: 103 mL/min/{1.73_m2} (ref >59)
GFR calc non Af Amer: 89 mL/min/{1.73_m2} (ref >59)
Globulin, Total: 2.5 g/dL (ref 1.5–4.5)
Glucose: 129 mg/dL — ABNORMAL HIGH (ref 65–99)
Potassium: 4.4 mmol/L (ref 3.5–5.2)
Sodium: 142 mmol/L (ref 134–144)
Total Protein: 6.8 g/dL (ref 6.0–8.5)

## 2017-11-01 LAB — ALPHA-GAL PANEL
Alpha Gal IgE*: <0.1 kU/L (ref <0.10)
Beef (Bos spp) IgE: <0.1 kU/L (ref <0.35)
Class Interpretation: 0
Class Interpretation: 0
Class Interpretation: 0
Lamb/Mutton (Ovis spp) IgE: <0.1 kU/L (ref <0.35)
Pork (Sus spp) IgE: <0.1 kU/L (ref <0.35)

## 2017-11-01 LAB — CBC WITH DIFFERENTIAL/PLATELET
Basophils Absolute: 0.1 10*3/uL (ref 0.0–0.2)
Basos: 1 %
EOS (ABSOLUTE): 0.3 10*3/uL (ref 0.0–0.4)
Eos: 4 %
Hematocrit: 45.5 % (ref 37.5–51.0)
Hemoglobin: 15 g/dL (ref 13.0–17.7)
Immature Grans (Abs): 0 10*3/uL (ref 0.0–0.1)
Immature Granulocytes: 0 %
Lymphocytes Absolute: 2.2 10*3/uL (ref 0.7–3.1)
Lymphs: 29 %
MCH: 30.9 pg (ref 26.6–33.0)
MCHC: 33 g/dL (ref 31.5–35.7)
MCV: 94 fL (ref 79–97)
Monocytes Absolute: 0.7 10*3/uL (ref 0.1–0.9)
Monocytes: 9 %
Neutrophils Absolute: 4.3 10*3/uL (ref 1.4–7.0)
Neutrophils: 57 %
Platelets: 272 10*3/uL (ref 150–450)
RBC: 4.85 x10E6/uL (ref 4.14–5.80)
RDW: 13 % (ref 12.3–15.4)
WBC: 7.5 10*3/uL (ref 3.4–10.8)

## 2017-11-01 LAB — C1 ESTERASE INHIBITOR, FUNCTIONAL: C1INH Functional/C1INH Total MFr SerPl: >92 %mean normal

## 2017-11-01 LAB — COMPLEMENT COMPONENT C1Q: Complement C1Q: 12.4 mg/dL (ref 11.8–23.8)

## 2017-11-01 LAB — C1 ESTERASE INHIBITOR: C1INH SerPl-mCnc: 24 mg/dL (ref 21–39)

## 2017-11-01 LAB — TRYPTASE: Tryptase: 4.3 ug/L (ref 2.2–13.2)

## 2017-11-01 LAB — C4 COMPLEMENT: Complement C4, Serum: 20 mg/dL (ref 14–44)

## 2017-11-20 ENCOUNTER — Telehealth: Payer: Self-pay | Admitting: Allergy and Immunology

## 2017-11-20 DIAGNOSIS — T783XXD Angioneurotic edema, subsequent encounter: Secondary | ICD-10-CM | POA: Diagnosis not present

## 2017-11-20 DIAGNOSIS — I10 Essential (primary) hypertension: Secondary | ICD-10-CM | POA: Diagnosis not present

## 2017-11-20 NOTE — Telephone Encounter (Signed)
Dr. Toniann FailWendy Cooper's office called today and needs all labs and allergy testing from Craig Cooper's appointment on 10-24-17. Also, would like to know the next plan of action for him. She said they had the last office notes.

## 2017-11-20 NOTE — Telephone Encounter (Signed)
Labs and allergy testing from 10/24/17 faxed to Dr. Selena BattenWendy McNeil.

## 2017-11-23 ENCOUNTER — Telehealth: Payer: Self-pay

## 2017-11-23 NOTE — Telephone Encounter (Signed)
Left message advising patient to call us back or reach out via patient portal.

## 2017-11-23 NOTE — Telephone Encounter (Signed)
Patient would like a nurse to give him a call  To get a clear understanding of his labs and skin test.

## 2017-11-28 DIAGNOSIS — N400 Enlarged prostate without lower urinary tract symptoms: Secondary | ICD-10-CM | POA: Diagnosis not present

## 2017-11-28 DIAGNOSIS — Z125 Encounter for screening for malignant neoplasm of prostate: Secondary | ICD-10-CM | POA: Diagnosis not present

## 2017-11-28 NOTE — Telephone Encounter (Signed)
Please provide a prescription for prednisone 20 mg x 10 tablets.  However, recurrent use of prednisone is not an acceptable option over the long-term.  All skin tests and lab work skin tests and lab work were all negative.  Please refer to [facility] allergy or [facility] allergy for further evaluation of angioedema.  Thank you.

## 2017-11-28 NOTE — Telephone Encounter (Signed)
Patient has had about 4 episodes of tongue swelling and lip swelling since his visit. He has been keeping a journal and has not seen been about to pinpoint anything that may be causing the issue. Patient states that when he gets the symptoms he takes Benadryl 2 tablets and 1- 50 mg Prednisone and within about 3-4 hours his symptoms have resolved. Patient has about 7 or 8 - 50 mg Prednisone left and he feels like that without the Prednisone he would really be defenseless. He would like to know if you would be willing to give him some more Prednisone to keep on hand. Please advise.

## 2017-11-29 MED ORDER — PREDNISONE 20 MG PO TABS: 20.0000 mg | ORAL_TABLET | Freq: Every day | ORAL | 0 refills | Status: AC

## 2017-11-29 NOTE — Addendum Note (Signed)
Addended by: Mliss Fritz I on: 11/29/2017 09:57 AM   Modules accepted: Orders

## 2017-11-29 NOTE — Telephone Encounter (Signed)
Patient informed of this information as well as medication recommendation. I advised of referral and he requested [facility] as it is closer for him. I am sending referral request to our outgoing coordinator.

## 2017-12-02 DIAGNOSIS — T63484D Toxic effect of venom of other arthropod, undetermined, subsequent encounter: Secondary | ICD-10-CM | POA: Diagnosis not present

## 2017-12-02 DIAGNOSIS — W57XXXA Bitten or stung by nonvenomous insect and other nonvenomous arthropods, initial encounter: Secondary | ICD-10-CM | POA: Diagnosis not present

## 2017-12-04 NOTE — Telephone Encounter (Signed)
Patient is scheduled to see Dr Zena Amos on 12/08/17 @ 10:40 at the [place] location.  Notes and labs have been sent to their office.  Patient has been informed.   Thanks

## 2017-12-04 NOTE — Telephone Encounter (Signed)
Thank you so much

## 2017-12-07 DIAGNOSIS — Z23 Encounter for immunization: Secondary | ICD-10-CM | POA: Diagnosis not present

## 2017-12-08 DIAGNOSIS — Z79899 Other long term (current) drug therapy: Secondary | ICD-10-CM | POA: Diagnosis not present

## 2017-12-08 DIAGNOSIS — T783XXA Angioneurotic edema, initial encounter: Secondary | ICD-10-CM | POA: Diagnosis not present

## 2018-01-08 DIAGNOSIS — R972 Elevated prostate specific antigen [PSA]: Secondary | ICD-10-CM | POA: Diagnosis not present

## 2018-01-15 DIAGNOSIS — M7918 Myalgia, other site: Secondary | ICD-10-CM | POA: Diagnosis not present

## 2018-01-22 DIAGNOSIS — M7912 Myalgia of auxiliary muscles, head and neck: Secondary | ICD-10-CM | POA: Diagnosis not present

## 2018-01-22 DIAGNOSIS — M546 Pain in thoracic spine: Secondary | ICD-10-CM | POA: Diagnosis not present

## 2018-01-22 DIAGNOSIS — M62830 Muscle spasm of back: Secondary | ICD-10-CM | POA: Diagnosis not present

## 2018-01-22 DIAGNOSIS — M5412 Radiculopathy, cervical region: Secondary | ICD-10-CM | POA: Diagnosis not present

## 2018-01-23 DIAGNOSIS — M7912 Myalgia of auxiliary muscles, head and neck: Secondary | ICD-10-CM | POA: Diagnosis not present

## 2018-01-23 DIAGNOSIS — M5412 Radiculopathy, cervical region: Secondary | ICD-10-CM | POA: Diagnosis not present

## 2018-01-23 DIAGNOSIS — M546 Pain in thoracic spine: Secondary | ICD-10-CM | POA: Diagnosis not present

## 2018-01-23 DIAGNOSIS — M62830 Muscle spasm of back: Secondary | ICD-10-CM | POA: Diagnosis not present

## 2018-01-24 DIAGNOSIS — M62830 Muscle spasm of back: Secondary | ICD-10-CM | POA: Diagnosis not present

## 2018-01-24 DIAGNOSIS — M5412 Radiculopathy, cervical region: Secondary | ICD-10-CM | POA: Diagnosis not present

## 2018-01-24 DIAGNOSIS — M546 Pain in thoracic spine: Secondary | ICD-10-CM | POA: Diagnosis not present

## 2018-01-24 DIAGNOSIS — M7912 Myalgia of auxiliary muscles, head and neck: Secondary | ICD-10-CM | POA: Diagnosis not present

## 2018-01-25 DIAGNOSIS — M546 Pain in thoracic spine: Secondary | ICD-10-CM | POA: Diagnosis not present

## 2018-01-25 DIAGNOSIS — M5412 Radiculopathy, cervical region: Secondary | ICD-10-CM | POA: Diagnosis not present

## 2018-01-25 DIAGNOSIS — M62830 Muscle spasm of back: Secondary | ICD-10-CM | POA: Diagnosis not present

## 2018-01-25 DIAGNOSIS — M7912 Myalgia of auxiliary muscles, head and neck: Secondary | ICD-10-CM | POA: Diagnosis not present

## 2018-01-29 ENCOUNTER — Encounter: Payer: Self-pay | Admitting: Sports Medicine

## 2018-01-29 ENCOUNTER — Ambulatory Visit (INDEPENDENT_AMBULATORY_CARE_PROVIDER_SITE_OTHER): Payer: PPO

## 2018-01-29 ENCOUNTER — Ambulatory Visit (INDEPENDENT_AMBULATORY_CARE_PROVIDER_SITE_OTHER): Payer: PPO | Admitting: Sports Medicine

## 2018-01-29 VITALS — BP 160/96 | HR 63 | Ht 71.0 in | Wt 229.8 lb

## 2018-01-29 DIAGNOSIS — M47812 Spondylosis without myelopathy or radiculopathy, cervical region: Secondary | ICD-10-CM | POA: Diagnosis not present

## 2018-01-29 DIAGNOSIS — M79601 Pain in right arm: Secondary | ICD-10-CM

## 2018-01-29 DIAGNOSIS — M4722 Other spondylosis with radiculopathy, cervical region: Secondary | ICD-10-CM | POA: Diagnosis not present

## 2018-01-29 MED ORDER — GABAPENTIN 400 MG PO CAPS: 400.0000 mg | ORAL_CAPSULE | Freq: Three times a day (TID) | ORAL | 1 refills | Status: AC

## 2018-01-29 MED ORDER — METHYLPREDNISOLONE 4 MG PO TBPK: ORAL_TABLET | ORAL | 0 refills | Status: DC

## 2018-01-29 NOTE — Progress Notes (Signed)
Craig Cooper. Craig [facility] [id number]  Craig Cooper  - 68 y.o. male MRN [id number]  Date of birth: [birth year]  Visit Date: 01/29/2018  PCP: Gweneth Dimitri, MD   Referred by: Gweneth Dimitri, MD   Scribe(s) for today's visit: Christoper Fabian, LAT, ATC  SUBJECTIVE:  Craig Cooper is here for New Patient (Initial Visit) (R scapular and R UE pain)    HPI: His R scapular and UE symptoms INITIALLY: Began 01/04/18 w/ no known MOI Described as moderate pain in the R medial scapular region, radiating to R UE along post-lat arm to the back of the hand Worsened with standing Improved with nothing Additional associated symptoms include: some N/T and weakness noted in the R UE; some mechanical symptoms noted in R shoulder    At this time symptoms show no change compared to onset to moderately worse. He has been taking IBU prn w/ no relief.  Has tried meloxicam and flexeril prescribed by PCP w/ no relief.  Hx of L-spine L4-5 disc surgery  No c-spine or R shoulder XRs  REVIEW OF SYSTEMS: Reports night time disturbances. Denies fevers, chills, or night sweats. Denies unexplained weight loss. Denies personal history of cancer. Denies changes in bowel or bladder habits. Denies recent unreported falls. Denies new or worsening dyspnea or wheezing. Denies headaches or dizziness.  Reports numbness, tingling or weakness  In the extremities - in R UE Denies dizziness or presyncopal episodes Denies lower extremity edema    HISTORY:  Prior history reviewed and updated per electronic medical record.  Social History   Occupational History  . Occupation: RETIRED  Tobacco Use  . Smoking status: Former Games developer  . Smokeless tobacco: Never Used  Substance and Sexual Activity  . Alcohol use: Yes    Comment: occ  . Drug use: Never  . Sexual activity: Not on file   Social History   Social History Narrative  . Not on file   Past  Medical History:  Diagnosis Date  . Angio-edema   . GERD (gastroesophageal reflux disease)   . Hx of colonic polyp 08/31/2010  . Hypertension   . Pinched nerve in neck    FOLLOWED BY DR. Berline Chough  . TIA (transient ischemic attack) 09/2003  . Vitamin D deficiency    Past Surgical History:  Procedure Laterality Date  . BACK SURGERY  2000, 1995  . MOUTH SURGERY  1980  . SPINE SURGERY  1992/1998  . TONSILLECTOMY    . WISDOM TOOTH EXTRACTION     family history includes Hypertension in his father and mother.  DATA OBTAINED & REVIEWED:   Recent Labs    10/24/17 1522  CALCIUM 9.3  AST 21  ALT 19   No problems updated. No specialty comments available. 01/29/2018: X-ray cervical spine: Loss of cervical lordosis with C5-6 and C6-7 degenerative changes.  OBJECTIVE:  VS:  HT:5\' 11"  (180.3 cm)   WT:229 lb 12.8 oz (104.2 kg)  BMI:32.06    BP:(!) 160/96  HR:63bpm  TEMP: ( )  RESP:98 %   PHYSICAL EXAM: CONSTITUTIONAL: Well-developed, Well-nourished and In no acute distress EYES: Pupils are equal., EOM intact without nystagmus. and No scleral icterus. Psychiatric: Alert & appropriately interactive. and Not depressed or anxious appearing. EXTREMITY EXAM: Warm and well perfused  SHOULDER Findings: No stiffness, no weakness, no crepitus noted  Neck:   Well aligned, no significant torticollis  Midline Bony TTP: none   Paraspinal Muscle Spasm: Yes, bilateral  CERVICAL ROM: limited flexion, extension, right rotation, left rotation, right lateral flexion and left lateral flexion  NEURAL TENSION SIGNS Right Left  Brachial Plexus Squeeze: normal, no pain normal, no pain  Arm Squeeze Test: normal, no pain normal, no pain  Spurling's Compression Test: positive, moderate pain normal, no pain  Lhermitte's Compression test: Positive, radiates [id number] periscapular region   REFLEXES Right Left  DTR - C5 -Biceps  2+ 2+  DTR - C6 - Brachiorad 2+ 2+  DTR - C7 - Triceps trace, 2+ 3+    UMN - Hoffman's Negative/Normal Negative/Normal   MOTOR TESTING: Diminished right triceps strength.    ASSESSMENT   1. Right arm pain   2. Osteoarthritis of spine with radiculopathy, cervical region      PROCEDURES:  None  PLAN:  Pertinent additional documentation may be included in corresponding procedure notes, imaging studies, problem based documentation and patient instructions.  No problem-specific Assessment & Plan notes found for this encounter.  Symptoms are consistent with a C7 radiculitis with altered strength and reflexes.  Symptomatic treatment as below and close follow-up recommended.   Activity modifications and the importance of avoiding exacerbating activities (limiting pain to no more than a 4 / 10 during or following activity) recommended and discussed.  Discussed red flag symptoms that warrant earlier emergent evaluation and patient voices understanding.   Meds ordered this encounter  Medications  . DISCONTD: methylPREDNISolone (MEDROL DOSEPAK) 4 MG TBPK tablet    Sig: Take by mouth as directed. Take 6 tablets on the first day prescribed then as directed.    Dispense:  21 tablet    Refill:  0  . gabapentin (NEURONTIN) 400 MG capsule    Sig: Take 1 capsule (400 mg total) by mouth 3 (three) times daily.    Dispense:  90 capsule    Refill:  1   Lab Orders  No laboratory test(s) ordered today   Imaging Orders     DG Cervical Spine 2 or 3 views Referral Orders  No referral(s) requested today    At follow up will plan to consider: Further advanced diagnostic testing versus OMT.  Return in about 2 weeks (around 02/12/2018) for consideration of Osteopathic Manipulation.     CMA/ATC served as Neurosurgeon during this visit. History, Physical, and Plan performed by medical provider. Documentation and orders reviewed and attested to.      Andrena Mews, DO    [facility]

## 2018-01-31 ENCOUNTER — Telehealth: Payer: Self-pay | Admitting: Sports Medicine

## 2018-01-31 NOTE — Telephone Encounter (Signed)
Copied from CRM [id number]. Topic: General - Other >> Jan 31, 2018 12:47 PM Mcneil, Ja-Kwan wrote: Reason for CRM: Pt called to question whether he should increase the amount of gabapentin (NEURONTIN) 400 MG capsule that he is taking if he is not getting any relief. Pt requests call back. Cb# [phone]

## 2018-01-31 NOTE — Telephone Encounter (Signed)
Called pt and he states that he is taking 400 mg Gabapentin TID.  Inform pt that Dr. Berline Chough says to double his nighttime dose so that he will be taking two 400mg  capsules (800mg ) before bed.  Pt voices understanding and states that he will f/u with Dr. Berline Chough in approximately 2 weeks.

## 2018-02-12 ENCOUNTER — Ambulatory Visit (INDEPENDENT_AMBULATORY_CARE_PROVIDER_SITE_OTHER): Payer: PPO | Admitting: Sports Medicine

## 2018-02-12 ENCOUNTER — Encounter: Payer: Self-pay | Admitting: Sports Medicine

## 2018-02-12 ENCOUNTER — Ambulatory Visit: Payer: PPO | Admitting: Sports Medicine

## 2018-02-12 VITALS — BP 150/88 | HR 73 | Ht 71.0 in | Wt 230.6 lb

## 2018-02-12 DIAGNOSIS — M79601 Pain in right arm: Secondary | ICD-10-CM

## 2018-02-12 DIAGNOSIS — M4722 Other spondylosis with radiculopathy, cervical region: Secondary | ICD-10-CM | POA: Diagnosis not present

## 2018-02-12 MED ORDER — TRAMADOL HCL 50 MG PO TABS: 50.0000 mg | ORAL_TABLET | Freq: Four times a day (QID) | ORAL | 0 refills | Status: AC | PRN

## 2018-02-12 NOTE — Progress Notes (Signed)
Veverly FellsMichael D. Delorise Shinerigby, DO  [facility] [place] [phone]  Lorra HalsVirgil Wayne Heiser - 67 y.o. male MRN [id number]  Date of birth: 03/07/1951  Visit Date: 02/12/2018  PCP: Gweneth DimitriMcNeill, Wendy, MD   Referred by: Gweneth DimitriMcNeill, Wendy, MD   Scribe(s) for today's visit: Christoper FabianMolly Weber, LAT, ATC  SUBJECTIVE:  Mariane MastersVirgil Wayne Nordstrom [facility] here for Follow-up (Neck and R UE pain)    HPI: His R scapular and UE symptoms INITIALLY: Began 01/04/18 w/ no known MOI Described as moderate pain in the R medial scapular region, radiating to R UE along post-lat arm to the back of the hand Worsened with standing Improved with nothing Additional associated symptoms include: some N/T and weakness noted in the R UE; some mechanical symptoms noted in R shoulder    At this time symptoms show no change compared to onset to moderately worse. He has been taking IBU prn w/ no relief.  Has tried meloxicam and flexeril prescribed by PCP w/ no relief.  Hx of L-spine L4-5 disc surgery  No c-spine or R shoulder XRs  02/12/2018: Compared to the last office visit on 01/29/18, his previously described neck and R UE symptoms show no change.  He felt a lot better for a few days when he was taking the prednisone but his symptoms are now back to the same as before. Current symptoms are moderate pain in the R medial scapular region, radiating to R UE along post-lat arm to the back of the hand He has been taking Gabapentin 3-4x/day and using a steroid dose pack which he has finished.  REVIEW OF SYSTEMS: Reports night time disturbances. Denies fevers, chills, or night sweats. Denies unexplained weight loss. Denies personal history of cancer. Denies changes in bowel or bladder habits. Denies recent unreported falls. Denies new or worsening dyspnea or wheezing. Denies headaches or dizziness.  Reports numbness, tingling or weakness  In the extremities - in R UE Denies dizziness or presyncopal  episodes Denies lower extremity edema    HISTORY:  Prior history reviewed and updated per electronic medical record.  Social History   Occupational History  . Not on file  Tobacco Use  . Smoking status: Former Games developermoker  . Smokeless tobacco: Never Used  Substance and Sexual Activity  . Alcohol use: Yes    Comment: occ  . Drug use: Not on file  . Sexual activity: Not on file   Social History   Social History Narrative  . Not on file     DATA OBTAINED & REVIEWED:  No results for input(s): HGBA1C, LABURIC, CREATINE in the last 8760 hours. Problem  Osteoarthritis of Spine With Radiculopathy, Cervical Region   . 01/29/2018 x-rays cervical spine show straightening the cervical lordosis with moderate degenerative changes at C5-6 and C6-7.  OBJECTIVE:  VS:  HT:5\' 11"  (180.3 cm)   WT:230 lb 9.6 oz (104.6 kg)  BMI:32.18    BP:(!) 150/88  HR:73bpm  TEMP: ( )  RESP:97 %   PHYSICAL EXAM: CONSTITUTIONAL: Well-developed, Well-nourished and In no acute distress PSYCHIATRIC: Alert & appropriately interactive. and Not depressed or anxious appearing. RESPIRATORY: No increased work of breathing and Trachea Midline EYES: Pupils are equal., EOM intact without nystagmus. and No scleral icterus.  VASCULAR EXAM: Warm and well perfused NEURO: unremarkable  MSK Exam: SHOULDER Findings: No stiffness, no weakness, no crepitus noted  Neck:   Well aligned, no significant torticollis  Midline Bony TTP: none   Paraspinal Muscle Spasm: Yes, right  CERVICAL ROM: limited right rotation and right lateral flexion  NEURAL TENSION SIGNS Right Left  Brachial Plexus Squeeze: normal, no pain normal, no pain  Arm Squeeze Test: normal, no pain normal, no pain  Spurling's Compression Test: positive, severe pain normal, no pain  Lhermitte's Compression test: Positive, radiates IO:NGEXB periscapular region   REFLEXES Right Left  DTR - C5 -Biceps  2+ 2+  DTR - C6 - Brachiorad 2+ 2+  DTR - C7 -  Triceps trace 3+   UMN - Hoffman's Negative/Normal Negative/Normal   MOTOR TESTING: Diminished right triceps strength otherwise all upper extremity myotomes are intact to manual muscle testing.   ASSESSMENT   1. Right arm pain   2. Osteoarthritis of spine with radiculopathy, cervical region     PLAN:  Pertinent additional documentation may be included in corresponding procedure notes, imaging studies, problem based documentation and patient instructions.  Procedures:  . None  Medications:  Meds ordered this encounter  Medications  . traMADol (ULTRAM) 50 MG tablet    Sig: Take 1 tablet (50 mg total) by mouth every 6 (six) hours as needed for up to 5 days for moderate pain.    Dispense:  20 tablet    Refill:  0   Discussion/Instructions: Osteoarthritis of spine with radiculopathy, cervical region Does have persistent right arm weakness with diminished reflexes and weakness within the right triceps.  Given the findings on x-ray and clinical findings that are not improving with conservative care further diagnostic evaluation with MRI of the cervical spine recommended at this time.  Discussed referral to Dr. Alvester Morin for epidural steroid injection pending these results but as long as it does show anticipated C7 radiculopathy will plan to refer directly after obtaining the results.  8-week follow-up for clinical reexam.  Short course of tramadol provided.  . Further Testing ordered: MRI of the cervical spine . Discussed red flag symptoms that warrant earlier emergent evaluation and patient voices understanding. . Activity modifications and the importance of avoiding exacerbating activities (limiting pain to no more than a 4 / 10 during or following activity) recommended and discussed. . >50% of this 25 minutes minute visit spent in direct patient counseling and/or coordination of care. Discussion was focused on education regarding the in discussing the pathoetiology and anticipated  clinical course of the above condition.  Follow-up:  . Return in about 8 weeks (around 04/09/2018) for repeat clinical exam.  . At follow up will plan: For repeat clinical exam.  If any lack of improvement can consider return to Dr. Alvester Morin for repeat epidural steroid injections versus neurosurgical evaluation based on the MRI results.  We will plan for direct referral following MRI     CMA/ATC served as scribe during this visit. History, Physical, and Plan performed by medical provider. Documentation and orders reviewed and attested to.      Andrena Mews, DO    [facility] Physician

## 2018-02-12 NOTE — Assessment & Plan Note (Signed)
Does have persistent right arm weakness with diminished reflexes and weakness within the right triceps.  Given the findings on x-ray and clinical findings that are not improving with conservative care further diagnostic evaluation with MRI of the cervical spine recommended at this time.  Discussed referral to Dr. Alvester MorinNewton for epidural steroid injection pending these results but as long as it does show anticipated C7 radiculopathy will plan to refer directly after obtaining the results.  8-week follow-up for clinical reexam.  Short course of tramadol provided.

## 2018-02-12 NOTE — Patient Instructions (Signed)

## 2018-02-26 ENCOUNTER — Encounter: Payer: Self-pay | Admitting: Sports Medicine

## 2018-02-26 DIAGNOSIS — E559 Vitamin D deficiency, unspecified: Secondary | ICD-10-CM | POA: Diagnosis not present

## 2018-02-26 DIAGNOSIS — M7918 Myalgia, other site: Secondary | ICD-10-CM | POA: Diagnosis not present

## 2018-02-26 DIAGNOSIS — N400 Enlarged prostate without lower urinary tract symptoms: Secondary | ICD-10-CM | POA: Diagnosis not present

## 2018-02-26 DIAGNOSIS — Z0000 Encounter for general adult medical examination without abnormal findings: Secondary | ICD-10-CM | POA: Diagnosis not present

## 2018-02-26 DIAGNOSIS — E782 Mixed hyperlipidemia: Secondary | ICD-10-CM | POA: Diagnosis not present

## 2018-02-26 DIAGNOSIS — Z79899 Other long term (current) drug therapy: Secondary | ICD-10-CM | POA: Diagnosis not present

## 2018-02-26 DIAGNOSIS — Z8673 Personal history of transient ischemic attack (TIA), and cerebral infarction without residual deficits: Secondary | ICD-10-CM | POA: Diagnosis not present

## 2018-02-26 DIAGNOSIS — R972 Elevated prostate specific antigen [PSA]: Secondary | ICD-10-CM | POA: Diagnosis not present

## 2018-02-26 DIAGNOSIS — I10 Essential (primary) hypertension: Secondary | ICD-10-CM | POA: Diagnosis not present

## 2018-02-26 DIAGNOSIS — L501 Idiopathic urticaria: Secondary | ICD-10-CM | POA: Diagnosis not present

## 2018-02-26 DIAGNOSIS — K219 Gastro-esophageal reflux disease without esophagitis: Secondary | ICD-10-CM | POA: Diagnosis not present

## 2018-02-28 ENCOUNTER — Other Ambulatory Visit: Payer: Self-pay

## 2018-03-05 DIAGNOSIS — J069 Acute upper respiratory infection, unspecified: Secondary | ICD-10-CM | POA: Diagnosis not present

## 2018-03-09 ENCOUNTER — Telehealth: Payer: Self-pay | Admitting: *Deleted

## 2018-03-09 NOTE — Telephone Encounter (Signed)
Referral sent to scheduling and notes on file. °

## 2018-03-23 DIAGNOSIS — R0602 Shortness of breath: Secondary | ICD-10-CM | POA: Diagnosis not present

## 2018-03-23 DIAGNOSIS — R05 Cough: Secondary | ICD-10-CM | POA: Diagnosis not present

## 2018-03-23 DIAGNOSIS — R062 Wheezing: Secondary | ICD-10-CM | POA: Diagnosis not present

## 2018-04-06 ENCOUNTER — Encounter: Payer: Self-pay | Admitting: Interventional Cardiology

## 2018-04-09 ENCOUNTER — Ambulatory Visit: Payer: Self-pay | Admitting: Sports Medicine

## 2018-04-21 ENCOUNTER — Encounter: Payer: Self-pay | Admitting: Sports Medicine

## 2018-04-27 ENCOUNTER — Encounter: Payer: Self-pay | Admitting: Interventional Cardiology

## 2018-04-27 ENCOUNTER — Ambulatory Visit: Payer: PPO | Admitting: Interventional Cardiology

## 2018-04-27 VITALS — BP 152/92 | HR 54 | Ht 71.0 in | Wt 233.4 lb

## 2018-04-27 DIAGNOSIS — E782 Mixed hyperlipidemia: Secondary | ICD-10-CM

## 2018-04-27 DIAGNOSIS — I10 Essential (primary) hypertension: Secondary | ICD-10-CM

## 2018-04-27 DIAGNOSIS — R0609 Other forms of dyspnea: Secondary | ICD-10-CM

## 2018-04-27 MED ORDER — ASPIRIN EC 81 MG PO TBEC: 81.0000 mg | DELAYED_RELEASE_TABLET | Freq: Every day | ORAL | 3 refills | Status: AC

## 2018-04-27 NOTE — Patient Instructions (Signed)
Medication Instructions:  Your physician has recommended you make the following change in your medication:   DECREASE: aspirin to 81 mg once a day  If you need a refill on your cardiac medications before your next appointment, please call your pharmacy.   Lab work: None Ordered  If you have labs (blood work) drawn today and your tests are completely normal, you will receive your results only by: Marland Kitchen MyChart Message (if you have MyChart) OR . A paper copy in the mail If you have any lab test that is abnormal or we need to change your treatment, we will call you to review the results.  Testing/Procedures: Your physician has requested that you have an exercise tolerance test. For further information please visit [email or web address] Please also follow instruction sheet, as given.   Follow-Up: . Based on test results  Any Other Special Instructions Will Be Listed Below (If Applicable).  Exercise Stress Test An exercise stress test is a test to check how your heart works during exercise. You will need to walk on a treadmill or ride an exercise bike for this test. An electrocardiogram (ECG) will record your heartbeat when you are at rest and when you are exercising. You may have an ultrasound or nuclear test after the exercise test. The test is done to check for coronary artery disease (CAD). It is also done to:  See how well you can exercise.  Watch for high blood pressure during exercise.  Test how well you can exercise after treatment.  Check the blood flow to your arms and legs. If your test result is not normal, more testing may be needed. What happens before the procedure?  Follow instructions from your doctor about what you cannot eat or drink. ? Do not have any drinks or foods that have caffeine in them for 24 hours before the test, or as told by your doctor. This includes coffee, tea (even decaf tea), sodas, chocolate, and cocoa.  Ask your doctor about changing or stopping  your normal medicines. This is important if you: ? Take diabetes medicines. ? Take beta-blocker medicines. ? Wear a nitroglycerin patch.  If you use an inhaler, bring it with you to the test.  Do not put lotions, powders, creams, or oils on your chest before the test.  Wear comfortable shoes and clothing.  Do not use any products that have nicotine or tobacco in them, such as cigarettes and e-cigarettes. Stop using them at least 4 hours before the test. If you need help quitting, ask your doctor. What happens during the procedure?  Patches (electrodes) will be put on your chest.  Wires will be connected to the patches. The wires will send signals to a machine to record your heartbeat.  Your heart rate will be watched while you are resting and while you are exercising. Your blood pressure will also be watched during the test.  You will walk on a treadmill or use a stationary bike. If you cannot use these, you may be asked to turn a crank with your hands.  The activity will get harder and will raise your heart rate.  You may be asked to breathe into a tube a few times during the test. This measures the gases that you breathe out.  You will be asked how you are feeling throughout the test.  You will exercise until your heart reaches a target heart rate. You will stop early if: ? You feel dizzy. ? You have chest pain. ? You are  out of breath. ? Your blood pressure is too high or too low. ? You have an irregular heartbeat. ? You have pain or aching in your arms or legs. The procedure may vary among doctors and hospitals. What happens after the procedure?  Your blood pressure, heart rate, breathing rate, and blood oxygen level will be watched after the test.  You may return to your normal diet and activities as told by your doctor.  It is up to you to get the results of your test. Ask your doctor, or the department that is doing the test, when your results will be  ready. Summary  An exercise stress test is a test to check how your heart works during exercise.  This test is done to check for coronary artery disease.  Your heart rate will be watched while you are resting and while you are exercising.  Follow instructions from your doctor about what you cannot eat or drink before the test. This information is not intended to replace advice given to you by your health care provider. Make sure you discuss any questions you have with your health care provider. Document Released: 08/31/2007 Document Revised: 06/14/2016 Document Reviewed: 06/14/2016 Elsevier Interactive Patient Education  2019 [employer].

## 2018-04-27 NOTE — Progress Notes (Signed)
Cardiology Office Note   Date:  04/27/2018   ID:  Craig Cooper, DOB 10/01/1950, MRN [id number]  PCP:  Gweneth DimitriMcNeill, Wendy, MD    No chief complaint on file.  Hyperlipidemia  Wt Readings from Last 3 Encounters:  04/27/18 233 lb 6.4 oz (105.9 kg)  02/12/18 230 lb 9.6 oz (104.6 kg)  01/29/18 229 lb 12.8 oz (104.2 kg)       History of Present Illness: Craig Cooper is a 68 y.o. male who is being seen today for the evaluation of hyperlipidemia/CV risk at the request of Gweneth DimitriMcNeill, Wendy, MD.  ? PCSK-9 inhibitor candidate.   He has a family h/o heart disease, particularly on his Mom's side.  Several maternal uncles in the 360s who had heart disease.  He walks regularly at work.    Denies : Chest pain. Dizziness. Leg edema. Nitroglycerin use. Orthopnea. Palpitations. Paroxysmal nocturnal dyspnea.  Syncope.   He has some DOE with walking up stairs.   He was started on inhalers.  Years ago, he tried lipitor, Crestor, simvastatin and did tolerate due to muscle aches. He has not tried Zetia.    Past Medical History:  Diagnosis Date  . Angio-edema   . GERD (gastroesophageal reflux disease)   . Hx of colonic polyp 08/31/2010  . Hypertension   . Pinched nerve in neck    FOLLOWED BY DR. Berline ChoughIGBY  . TIA (transient ischemic attack) 09/2003  . Vitamin D deficiency     Past Surgical History:  Procedure Laterality Date  . BACK SURGERY  2000, 1995  . MOUTH SURGERY  1980  . SPINE SURGERY  1992/1998  . TONSILLECTOMY    . WISDOM TOOTH EXTRACTION       Current Outpatient Medications  Medication Sig Dispense Refill  . albuterol (PROVENTIL HFA;VENTOLIN HFA) 108 (90 Base) MCG/ACT inhaler INHALE 2 PUFFS BY MOUTH EVERY 6 HOURS AS NEEDED FOR 30 DAYS    . aspirin 325 MG tablet Take by mouth.    . bisoprolol (ZEBETA) 5 MG tablet 10 mg.   1  . cetirizine (ZYRTEC) 10 MG tablet Take 10 mg by mouth 2 (two) times daily.    . Cholecalciferol (VITAMIN D3) 2000 units TABS Take 4,000  Units/day by mouth daily.    Marland Kitchen. FLOVENT HFA 44 MCG/ACT inhaler Inhale 1 puff into the lungs 2 (two) times daily.    Marland Kitchen. gabapentin (NEURONTIN) 400 MG capsule Take 1 capsule (400 mg total) by mouth 3 (three) times daily. 90 capsule 1  . omeprazole (PRILOSEC) 20 MG capsule Take by mouth.    . tamsulosin (FLOMAX) 0.4 MG CAPS capsule Take 0.4 mg by mouth daily.    . vitamin B-12 (CYANOCOBALAMIN) 1000 MCG tablet Take 1,000 mcg by mouth daily.     No current facility-administered medications for this visit.     Allergies:   Cholesterol; Codeine; Lisinopril; Losartan potassium; and Vicodin [hydrocodone-acetaminophen]    Social History:  The patient  reports that he has quit smoking. He has never used smokeless tobacco. He reports current alcohol use. He reports that he does not use drugs.   Family History:  The patient's family history includes Hypertension in his father and mother.    ROS:  Please see the history of present illness.   Otherwise, review of systems are positive for intolerance of statins.   All other systems are reviewed and negative.    PHYSICAL EXAM: VS:  BP (!) 152/92   Pulse (!) 54   Ht  5\' 11"  (1.803 m)   Wt 233 lb 6.4 oz (105.9 kg)   SpO2 92%   BMI 32.55 kg/m  , BMI Body mass index is 32.55 kg/m. GEN: Well nourished, well developed, in no acute distress  HEENT: normal  Neck: no JVD, carotid bruits, or masses Cardiac: RRR; no murmurs, rubs, or gallops,no edema  Respiratory:  clear to auscultation bilaterally, normal work of breathing GI: soft, nontender, nondistended, + BS MS: no deformity or atrophy  Skin: warm and dry, no rash Neuro:  Strength and sensation are intact Psych: euthymic mood, full affect   EKG:   The ekg ordered today demonstrates NSR, nonspecific ST changes, borderline LVH   Recent Labs: 10/24/2017: ALT 19; BUN 18; Creatinine, Ser 0.90; Hemoglobin 15.0; Platelets 272; Potassium 4.4; Sodium 142   Lipid Panel No results found for: CHOL, TRIG,  HDL, CHOLHDL, VLDL, LDLCALC, LDLDIRECT   Other studies Reviewed: Additional studies/ records that were reviewed today with results demonstrating: Prior labs reviewed.   ASSESSMENT AND PLAN:  1. Cardiac RF: Lipids have been moderately elevated.  Last LDL was 151.  Will discuss with Pharm.D. regarding possible treatment options since he has been intolerant to multiple statins.  His triglycerides are also elevated.  Could consider Zetia 10 mg daily.  I am not sure if he would qualify for a PCSK9 inhibitor.  He does have a history of TIA but by his report, this is somewhat vague. 2. HTN: Controlled at home by the readings he gets there.  Mildly elevated today, but this is consistent for how it typically is in the doctor's office. 3. Dyspnea on exertion: Not sure if this is cardiac versus pulmonary.  We will plan for exercise treadmill test.  If there is any abnormality on the exercise treadmill test, would plan for coronary CTA.  He has some mild changes on his ECG which may decrease the specificity of the treadmill test.   Current medicines are reviewed at length with the patient today.  The patient concerns regarding his medicines were addressed.  The following changes have been made:  No change  Labs/ tests ordered today include:  No orders of the defined types were placed in this encounter.   Recommend 150 minutes/week of aerobic exercise Low fat, low carb, high fiber diet recommended  Disposition:   FU for ETT   Signed, Lance MussJayadeep , MD  04/27/2018 9:58 AM    [facility] [address], [place], [place]  [address] Phone: [phone]; Fax: [phone]

## 2018-05-01 ENCOUNTER — Telehealth: Payer: Self-pay | Admitting: Pharmacist

## 2018-05-01 DIAGNOSIS — R0602 Shortness of breath: Secondary | ICD-10-CM | POA: Diagnosis not present

## 2018-05-01 NOTE — Telephone Encounter (Signed)
Received message from Dr Eldridge Dace regarding lipid management. Pt is intolerant to rosuvastatin, simvastatin, and atorvastatin. Baseline LDL 151 above goal < 70 due to hx of TIA. Will submit prior authorization for PCSK9i therapy and call pt once we obtain approval. Anticipate that copay will be $180 for a 3 month supply with [employer] Medicare plan.

## 2018-05-02 NOTE — Telephone Encounter (Signed)
Prior authorization for Repatha has been approved, copay is $180 for a 3 month supply. Spoke with patient who is not sure about the cost. Also discussed possibility of trying low dose pravastatin or Zetia. He would like to think about the options and will let us know which option he would like to pursue. He stated he would like to focus on his diet as well. Will await patient's return call.

## 2018-05-09 ENCOUNTER — Ambulatory Visit (INDEPENDENT_AMBULATORY_CARE_PROVIDER_SITE_OTHER): Payer: PPO

## 2018-05-09 DIAGNOSIS — R0609 Other forms of dyspnea: Secondary | ICD-10-CM

## 2018-05-09 LAB — EXERCISE TOLERANCE TEST
CHL CUP RESTING HR STRESS: 62 {beats}/min
CHL RATE OF PERCEIVED EXERTION: 16
CSEPED: 8 min
CSEPEDS: 0 s
CSEPEW: 10.1 METS
CSEPPHR: 151 {beats}/min
MPHR: 153 {beats}/min
Percent HR: 98 %

## 2018-06-07 ENCOUNTER — Other Ambulatory Visit: Payer: Self-pay | Admitting: Sports Medicine

## 2018-07-12 DIAGNOSIS — H2513 Age-related nuclear cataract, bilateral: Secondary | ICD-10-CM | POA: Diagnosis not present

## 2018-08-18 ENCOUNTER — Encounter (HOSPITAL_COMMUNITY): Payer: Self-pay

## 2018-08-18 ENCOUNTER — Emergency Department (HOSPITAL_COMMUNITY)
Admission: EM | Admit: 2018-08-18 | Discharge: 2018-08-18 | Disposition: A | Discharge status: Home / Self Care | Payer: PPO | Attending: Emergency Medicine | Admitting: Emergency Medicine

## 2018-08-18 ENCOUNTER — Other Ambulatory Visit: Payer: Self-pay

## 2018-08-18 DIAGNOSIS — M549 Dorsalgia, unspecified: Secondary | ICD-10-CM | POA: Diagnosis not present

## 2018-08-18 DIAGNOSIS — Z87891 Personal history of nicotine dependence: Secondary | ICD-10-CM | POA: Diagnosis not present

## 2018-08-18 DIAGNOSIS — Y999 Unspecified external cause status: Secondary | ICD-10-CM | POA: Diagnosis not present

## 2018-08-18 DIAGNOSIS — Z8673 Personal history of transient ischemic attack (TIA), and cerebral infarction without residual deficits: Secondary | ICD-10-CM | POA: Insufficient documentation

## 2018-08-18 DIAGNOSIS — I10 Essential (primary) hypertension: Secondary | ICD-10-CM | POA: Insufficient documentation

## 2018-08-18 DIAGNOSIS — Z7982 Long term (current) use of aspirin: Secondary | ICD-10-CM | POA: Diagnosis not present

## 2018-08-18 DIAGNOSIS — Z79899 Other long term (current) drug therapy: Secondary | ICD-10-CM | POA: Insufficient documentation

## 2018-08-18 DIAGNOSIS — Y92410 Unspecified street and highway as the place of occurrence of the external cause: Secondary | ICD-10-CM | POA: Insufficient documentation

## 2018-08-18 DIAGNOSIS — Y9389 Activity, other specified: Secondary | ICD-10-CM | POA: Diagnosis not present
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
## 2018-09-04 DIAGNOSIS — M545 Low back pain: Secondary | ICD-10-CM | POA: Diagnosis not present

## 2018-09-04 DIAGNOSIS — F419 Anxiety disorder, unspecified: Secondary | ICD-10-CM | POA: Diagnosis not present

## 2018-09-04 DIAGNOSIS — I10 Essential (primary) hypertension: Secondary | ICD-10-CM | POA: Diagnosis not present

## 2018-09-04 DIAGNOSIS — T783XXD Angioneurotic edema, subsequent encounter: Secondary | ICD-10-CM | POA: Diagnosis not present

## 2018-09-04 DIAGNOSIS — R51 Headache: Secondary | ICD-10-CM | POA: Diagnosis not present

## 2018-09-04 DIAGNOSIS — R972 Elevated prostate specific antigen [PSA]: Secondary | ICD-10-CM | POA: Diagnosis not present

## 2018-09-04 DIAGNOSIS — E782 Mixed hyperlipidemia: Secondary | ICD-10-CM | POA: Diagnosis not present

## 2018-09-04 DIAGNOSIS — E559 Vitamin D deficiency, unspecified: Secondary | ICD-10-CM | POA: Diagnosis not present

## 2018-09-04 DIAGNOSIS — K219 Gastro-esophageal reflux disease without esophagitis: Secondary | ICD-10-CM | POA: Diagnosis not present

## 2018-09-04 DIAGNOSIS — R05 Cough: Secondary | ICD-10-CM | POA: Diagnosis not present

## 2018-09-04 DIAGNOSIS — N400 Enlarged prostate without lower urinary tract symptoms: Secondary | ICD-10-CM | POA: Diagnosis not present

## 2018-10-23 ENCOUNTER — Telehealth: Payer: Self-pay | Admitting: Pharmacist

## 2018-10-23 NOTE — Telephone Encounter (Addendum)
Spoke with pt - he is fearful of injections but if he can get the medication for free, he may be willing.   [facility] application submitted and pt approved. Repatha available on formulary without prior authorization.  Pt is still hesitant and prefers to think about it - I have emailed him information on Repatha. He will call clinic if he wishes to start therapy. Will need to send in rx and provide pharmacy with [facility] if he chooses to start on [place].

## 2018-10-23 NOTE — Telephone Encounter (Signed)
Left message for pt - Repatha previously approved but cost prohibitive at $180/3 month supply. Will see if pt qualifies for [employer] for copay assistance.

## 2018-10-25 NOTE — Telephone Encounter (Addendum)
Pt followed up and does not wish to start Repatha due to concern over side effects listed on Repatha label including increase in blood sugar and angioedema (reports he has unexplained periodic swelling of his mouth, tongue, and lips). Advised pt that the increase in blood sugar is very mild and not significantly different than patients who did not receive Repatha in trials. Also advised him that incidence of angioedema is < 1%. Encouraged him to reach out if he changes his mind and wishes to start on therapy, as it has already been approved by his insurance and I was able to get him approved for $0 copay through [employer].

## 2018-11-27 ENCOUNTER — Other Ambulatory Visit: Payer: Self-pay | Admitting: Allergy and Immunology

## 2018-12-25 DIAGNOSIS — M62830 Muscle spasm of back: Secondary | ICD-10-CM | POA: Diagnosis not present

## 2018-12-25 DIAGNOSIS — M546 Pain in thoracic spine: Secondary | ICD-10-CM | POA: Diagnosis not present

## 2018-12-25 DIAGNOSIS — M7912 Myalgia of auxiliary muscles, head and neck: Secondary | ICD-10-CM | POA: Diagnosis not present

## 2018-12-25 DIAGNOSIS — M5412 Radiculopathy, cervical region: Secondary | ICD-10-CM | POA: Diagnosis not present

## 2018-12-27 DIAGNOSIS — M5413 Radiculopathy, cervicothoracic region: Secondary | ICD-10-CM | POA: Diagnosis not present

## 2018-12-27 DIAGNOSIS — M62830 Muscle spasm of back: Secondary | ICD-10-CM | POA: Diagnosis not present

## 2018-12-27 DIAGNOSIS — M5412 Radiculopathy, cervical region: Secondary | ICD-10-CM | POA: Diagnosis not present

## 2018-12-27 DIAGNOSIS — M5414 Radiculopathy, thoracic region: Secondary | ICD-10-CM | POA: Diagnosis not present

## 2018-12-27 DIAGNOSIS — G479 Sleep disorder, unspecified: Secondary | ICD-10-CM | POA: Diagnosis not present

## 2018-12-27 DIAGNOSIS — M5415 Radiculopathy, thoracolumbar region: Secondary | ICD-10-CM | POA: Diagnosis not present

## 2018-12-27 DIAGNOSIS — M7912 Myalgia of auxiliary muscles, head and neck: Secondary | ICD-10-CM | POA: Diagnosis not present

## 2018-12-27 DIAGNOSIS — M546 Pain in thoracic spine: Secondary | ICD-10-CM | POA: Diagnosis not present

## 2018-12-27 DIAGNOSIS — M5417 Radiculopathy, lumbosacral region: Secondary | ICD-10-CM | POA: Diagnosis not present

## 2019-01-17 DIAGNOSIS — M158 Other polyosteoarthritis: Secondary | ICD-10-CM | POA: Diagnosis not present

## 2019-01-17 DIAGNOSIS — N400 Enlarged prostate without lower urinary tract symptoms: Secondary | ICD-10-CM | POA: Diagnosis not present

## 2019-01-17 DIAGNOSIS — E782 Mixed hyperlipidemia: Secondary | ICD-10-CM | POA: Diagnosis not present

## 2019-01-17 DIAGNOSIS — I10 Essential (primary) hypertension: Secondary | ICD-10-CM | POA: Diagnosis not present

## 2019-01-29 DIAGNOSIS — R3912 Poor urinary stream: Secondary | ICD-10-CM | POA: Diagnosis not present

## 2019-01-29 DIAGNOSIS — N401 Enlarged prostate with lower urinary tract symptoms: Secondary | ICD-10-CM | POA: Diagnosis not present

## 2019-02-20 DIAGNOSIS — M158 Other polyosteoarthritis: Secondary | ICD-10-CM | POA: Diagnosis not present

## 2019-02-20 DIAGNOSIS — N400 Enlarged prostate without lower urinary tract symptoms: Secondary | ICD-10-CM | POA: Diagnosis not present

## 2019-02-20 DIAGNOSIS — E782 Mixed hyperlipidemia: Secondary | ICD-10-CM | POA: Diagnosis not present

## 2019-02-20 DIAGNOSIS — I10 Essential (primary) hypertension: Secondary | ICD-10-CM | POA: Diagnosis not present

## 2019-03-12 DIAGNOSIS — E559 Vitamin D deficiency, unspecified: Secondary | ICD-10-CM | POA: Diagnosis not present

## 2019-03-12 DIAGNOSIS — Z79899 Other long term (current) drug therapy: Secondary | ICD-10-CM | POA: Diagnosis not present

## 2019-03-12 DIAGNOSIS — E782 Mixed hyperlipidemia: Secondary | ICD-10-CM | POA: Diagnosis not present

## 2019-03-12 DIAGNOSIS — Z0000 Encounter for general adult medical examination without abnormal findings: Secondary | ICD-10-CM | POA: Diagnosis not present

## 2019-03-12 DIAGNOSIS — F419 Anxiety disorder, unspecified: Secondary | ICD-10-CM | POA: Diagnosis not present

## 2019-03-12 DIAGNOSIS — Z1389 Encounter for screening for other disorder: Secondary | ICD-10-CM | POA: Diagnosis not present

## 2019-03-12 DIAGNOSIS — M25519 Pain in unspecified shoulder: Secondary | ICD-10-CM | POA: Diagnosis not present

## 2019-03-12 DIAGNOSIS — N400 Enlarged prostate without lower urinary tract symptoms: Secondary | ICD-10-CM | POA: Diagnosis not present

## 2019-03-12 DIAGNOSIS — I10 Essential (primary) hypertension: Secondary | ICD-10-CM | POA: Diagnosis not present

## 2019-03-12 DIAGNOSIS — R972 Elevated prostate specific antigen [PSA]: Secondary | ICD-10-CM | POA: Diagnosis not present

## 2019-03-12 DIAGNOSIS — K219 Gastro-esophageal reflux disease without esophagitis: Secondary | ICD-10-CM | POA: Diagnosis not present

## 2019-03-12 DIAGNOSIS — J4520 Mild intermittent asthma, uncomplicated: Secondary | ICD-10-CM | POA: Diagnosis not present

## 2019-03-14 DIAGNOSIS — F419 Anxiety disorder, unspecified: Secondary | ICD-10-CM | POA: Diagnosis not present

## 2019-03-14 DIAGNOSIS — Z0000 Encounter for general adult medical examination without abnormal findings: Secondary | ICD-10-CM | POA: Diagnosis not present

## 2019-03-14 DIAGNOSIS — E782 Mixed hyperlipidemia: Secondary | ICD-10-CM | POA: Diagnosis not present

## 2019-03-14 DIAGNOSIS — K219 Gastro-esophageal reflux disease without esophagitis: Secondary | ICD-10-CM | POA: Diagnosis not present

## 2019-03-14 DIAGNOSIS — N400 Enlarged prostate without lower urinary tract symptoms: Secondary | ICD-10-CM | POA: Diagnosis not present

## 2019-03-14 DIAGNOSIS — Z6832 Body mass index (BMI) 32.0-32.9, adult: Secondary | ICD-10-CM | POA: Diagnosis not present

## 2019-03-14 DIAGNOSIS — E559 Vitamin D deficiency, unspecified: Secondary | ICD-10-CM | POA: Diagnosis not present

## 2019-03-14 DIAGNOSIS — I10 Essential (primary) hypertension: Secondary | ICD-10-CM | POA: Diagnosis not present

## 2019-03-14 DIAGNOSIS — Z79899 Other long term (current) drug therapy: Secondary | ICD-10-CM | POA: Diagnosis not present

## 2019-03-14 DIAGNOSIS — R972 Elevated prostate specific antigen [PSA]: Secondary | ICD-10-CM | POA: Diagnosis not present

## 2019-03-28 DIAGNOSIS — I10 Essential (primary) hypertension: Secondary | ICD-10-CM | POA: Diagnosis not present

## 2019-03-28 DIAGNOSIS — N400 Enlarged prostate without lower urinary tract symptoms: Secondary | ICD-10-CM | POA: Diagnosis not present

## 2019-03-28 DIAGNOSIS — M158 Other polyosteoarthritis: Secondary | ICD-10-CM | POA: Diagnosis not present

## 2019-03-28 DIAGNOSIS — E782 Mixed hyperlipidemia: Secondary | ICD-10-CM | POA: Diagnosis not present

## 2019-03-28 DIAGNOSIS — J4520 Mild intermittent asthma, uncomplicated: Secondary | ICD-10-CM | POA: Diagnosis not present

## 2019-04-23 ENCOUNTER — Ambulatory Visit: Payer: Self-pay

## 2019-05-02 ENCOUNTER — Ambulatory Visit: Payer: PPO | Attending: Internal Medicine

## 2019-05-02 ENCOUNTER — Ambulatory Visit: Payer: PPO

## 2019-05-02 DIAGNOSIS — Z23 Encounter for immunization: Secondary | ICD-10-CM

## 2019-05-02 NOTE — Progress Notes (Signed)
   Covid-19 Vaccination Clinic  Name:  Craig Cooper    MRN: [id number] DOB: 1951/01/15  05/02/2019  [name] was observed post Covid-19 immunization for 15 minutes without incidence. He was provided with Vaccine Information Sheet and instruction to access the V-Safe system.   [name] was instructed to call 911 with any severe reactions post vaccine: Marland Kitchen Difficulty breathing  . Swelling of your face and throat  . A fast heartbeat  . A bad rash all over your body  . Dizziness and weakness    Immunizations Administered    Name Date Dose VIS Date Route   Pfizer COVID-19 Vaccine 05/02/2019 11:53 AM 0.3 mL 03/08/2019 Intramuscular   Manufacturer: [employer], [employer]   Lot: [id number]   NDC: [address]-7

## 2019-05-20 DIAGNOSIS — E559 Vitamin D deficiency, unspecified: Secondary | ICD-10-CM | POA: Diagnosis not present

## 2019-05-20 DIAGNOSIS — E782 Mixed hyperlipidemia: Secondary | ICD-10-CM | POA: Diagnosis not present

## 2019-05-20 DIAGNOSIS — I10 Essential (primary) hypertension: Secondary | ICD-10-CM | POA: Diagnosis not present

## 2019-05-20 DIAGNOSIS — J4520 Mild intermittent asthma, uncomplicated: Secondary | ICD-10-CM | POA: Diagnosis not present

## 2019-05-20 DIAGNOSIS — N400 Enlarged prostate without lower urinary tract symptoms: Secondary | ICD-10-CM | POA: Diagnosis not present

## 2019-05-20 DIAGNOSIS — F419 Anxiety disorder, unspecified: Secondary | ICD-10-CM | POA: Diagnosis not present

## 2019-05-20 DIAGNOSIS — K219 Gastro-esophageal reflux disease without esophagitis: Secondary | ICD-10-CM | POA: Diagnosis not present

## 2019-05-20 DIAGNOSIS — Z79899 Other long term (current) drug therapy: Secondary | ICD-10-CM | POA: Diagnosis not present

## 2019-05-20 DIAGNOSIS — R972 Elevated prostate specific antigen [PSA]: Secondary | ICD-10-CM | POA: Diagnosis not present

## 2019-05-20 DIAGNOSIS — Z0000 Encounter for general adult medical examination without abnormal findings: Secondary | ICD-10-CM | POA: Diagnosis not present

## 2019-05-20 DIAGNOSIS — Z6832 Body mass index (BMI) 32.0-32.9, adult: Secondary | ICD-10-CM | POA: Diagnosis not present

## 2019-05-22 ENCOUNTER — Ambulatory Visit: Payer: PPO

## 2019-05-27 ENCOUNTER — Ambulatory Visit: Payer: PPO | Attending: Internal Medicine

## 2019-05-27 DIAGNOSIS — Z23 Encounter for immunization: Secondary | ICD-10-CM

## 2019-05-27 NOTE — Progress Notes (Signed)
   Covid-19 Vaccination Clinic  Name:  Craig Cooper    MRN: [id number] DOB: 12-30-1950  05/27/2019  [name] was observed post Covid-19 immunization for 15 minutes without incidence. He was provided with Vaccine Information Sheet and instruction to access the V-Safe system.   [name] was instructed to call 911 with any severe reactions post vaccine: Marland Kitchen Difficulty breathing  . Swelling of your face and throat  . A fast heartbeat  . A bad rash all over your body  . Dizziness and weakness    Immunizations Administered    Name Date Dose VIS Date Route   Pfizer COVID-19 Vaccine 05/27/2019  3:46 PM 0.3 mL 03/08/2019 Intramuscular   Manufacturer: [employer], [employer]   Lot: [id number]   NDC: [address]-1

## 2019-06-13 DIAGNOSIS — R0683 Snoring: Secondary | ICD-10-CM | POA: Diagnosis not present

## 2019-09-04 DIAGNOSIS — E782 Mixed hyperlipidemia: Secondary | ICD-10-CM | POA: Diagnosis not present

## 2019-09-09 DIAGNOSIS — Z79899 Other long term (current) drug therapy: Secondary | ICD-10-CM | POA: Diagnosis not present

## 2019-09-09 DIAGNOSIS — E782 Mixed hyperlipidemia: Secondary | ICD-10-CM | POA: Diagnosis not present

## 2019-09-09 DIAGNOSIS — I10 Essential (primary) hypertension: Secondary | ICD-10-CM | POA: Diagnosis not present

## 2019-09-09 DIAGNOSIS — N400 Enlarged prostate without lower urinary tract symptoms: Secondary | ICD-10-CM | POA: Diagnosis not present

## 2019-09-09 DIAGNOSIS — J4520 Mild intermittent asthma, uncomplicated: Secondary | ICD-10-CM | POA: Diagnosis not present

## 2019-09-09 DIAGNOSIS — T783XXA Angioneurotic edema, initial encounter: Secondary | ICD-10-CM | POA: Diagnosis not present

## 2019-09-09 DIAGNOSIS — M25519 Pain in unspecified shoulder: Secondary | ICD-10-CM | POA: Diagnosis not present

## 2019-09-09 DIAGNOSIS — E559 Vitamin D deficiency, unspecified: Secondary | ICD-10-CM | POA: Diagnosis not present

## 2019-09-09 DIAGNOSIS — K219 Gastro-esophageal reflux disease without esophagitis: Secondary | ICD-10-CM | POA: Diagnosis not present

## 2019-10-26 DIAGNOSIS — N400 Enlarged prostate without lower urinary tract symptoms: Secondary | ICD-10-CM | POA: Diagnosis not present

## 2019-10-26 DIAGNOSIS — E782 Mixed hyperlipidemia: Secondary | ICD-10-CM | POA: Diagnosis not present

## 2019-10-26 DIAGNOSIS — J4520 Mild intermittent asthma, uncomplicated: Secondary | ICD-10-CM | POA: Diagnosis not present

## 2019-10-26 DIAGNOSIS — I10 Essential (primary) hypertension: Secondary | ICD-10-CM | POA: Diagnosis not present

## 2019-10-26 DIAGNOSIS — M158 Other polyosteoarthritis: Secondary | ICD-10-CM | POA: Diagnosis not present

## 2019-11-21 DIAGNOSIS — E782 Mixed hyperlipidemia: Secondary | ICD-10-CM | POA: Diagnosis not present

## 2019-11-21 DIAGNOSIS — I10 Essential (primary) hypertension: Secondary | ICD-10-CM | POA: Diagnosis not present

## 2019-11-21 DIAGNOSIS — N400 Enlarged prostate without lower urinary tract symptoms: Secondary | ICD-10-CM | POA: Diagnosis not present

## 2019-11-21 DIAGNOSIS — J4520 Mild intermittent asthma, uncomplicated: Secondary | ICD-10-CM | POA: Diagnosis not present

## 2019-11-21 DIAGNOSIS — M158 Other polyosteoarthritis: Secondary | ICD-10-CM | POA: Diagnosis not present

## 2020-01-14 DIAGNOSIS — F5101 Primary insomnia: Secondary | ICD-10-CM | POA: Diagnosis not present

## 2020-01-16 DIAGNOSIS — J4520 Mild intermittent asthma, uncomplicated: Secondary | ICD-10-CM | POA: Diagnosis not present

## 2020-01-16 DIAGNOSIS — E782 Mixed hyperlipidemia: Secondary | ICD-10-CM | POA: Diagnosis not present

## 2020-01-16 DIAGNOSIS — I10 Essential (primary) hypertension: Secondary | ICD-10-CM | POA: Diagnosis not present

## 2020-01-16 DIAGNOSIS — N400 Enlarged prostate without lower urinary tract symptoms: Secondary | ICD-10-CM | POA: Diagnosis not present

## 2020-01-16 DIAGNOSIS — M158 Other polyosteoarthritis: Secondary | ICD-10-CM | POA: Diagnosis not present

## 2020-02-06 DIAGNOSIS — F410 Panic disorder [episodic paroxysmal anxiety] without agoraphobia: Secondary | ICD-10-CM | POA: Diagnosis not present

## 2020-02-06 DIAGNOSIS — F418 Other specified anxiety disorders: Secondary | ICD-10-CM | POA: Diagnosis not present

## 2020-02-17 DIAGNOSIS — J4520 Mild intermittent asthma, uncomplicated: Secondary | ICD-10-CM | POA: Diagnosis not present

## 2020-02-17 DIAGNOSIS — N400 Enlarged prostate without lower urinary tract symptoms: Secondary | ICD-10-CM | POA: Diagnosis not present

## 2020-02-17 DIAGNOSIS — K219 Gastro-esophageal reflux disease without esophagitis: Secondary | ICD-10-CM | POA: Diagnosis not present

## 2020-02-17 DIAGNOSIS — M158 Other polyosteoarthritis: Secondary | ICD-10-CM | POA: Diagnosis not present

## 2020-02-17 DIAGNOSIS — E782 Mixed hyperlipidemia: Secondary | ICD-10-CM | POA: Diagnosis not present

## 2020-02-17 DIAGNOSIS — I10 Essential (primary) hypertension: Secondary | ICD-10-CM | POA: Diagnosis not present

## 2020-03-09 DIAGNOSIS — I10 Essential (primary) hypertension: Secondary | ICD-10-CM | POA: Diagnosis not present

## 2020-03-09 DIAGNOSIS — N400 Enlarged prostate without lower urinary tract symptoms: Secondary | ICD-10-CM | POA: Diagnosis not present

## 2020-03-09 DIAGNOSIS — J4520 Mild intermittent asthma, uncomplicated: Secondary | ICD-10-CM | POA: Diagnosis not present

## 2020-03-09 DIAGNOSIS — M7061 Trochanteric bursitis, right hip: Secondary | ICD-10-CM | POA: Diagnosis not present

## 2020-03-09 DIAGNOSIS — E782 Mixed hyperlipidemia: Secondary | ICD-10-CM | POA: Diagnosis not present

## 2020-03-09 DIAGNOSIS — F410 Panic disorder [episodic paroxysmal anxiety] without agoraphobia: Secondary | ICD-10-CM | POA: Diagnosis not present

## 2020-03-09 DIAGNOSIS — G479 Sleep disorder, unspecified: Secondary | ICD-10-CM | POA: Diagnosis not present

## 2020-03-09 DIAGNOSIS — Z79899 Other long term (current) drug therapy: Secondary | ICD-10-CM | POA: Diagnosis not present

## 2020-03-09 DIAGNOSIS — E559 Vitamin D deficiency, unspecified: Secondary | ICD-10-CM | POA: Diagnosis not present

## 2020-03-09 DIAGNOSIS — F419 Anxiety disorder, unspecified: Secondary | ICD-10-CM | POA: Diagnosis not present

## 2020-03-09 DIAGNOSIS — K219 Gastro-esophageal reflux disease without esophagitis: Secondary | ICD-10-CM | POA: Diagnosis not present

## 2020-03-13 DIAGNOSIS — Z0000 Encounter for general adult medical examination without abnormal findings: Secondary | ICD-10-CM | POA: Diagnosis not present

## 2020-03-13 DIAGNOSIS — Z7189 Other specified counseling: Secondary | ICD-10-CM | POA: Diagnosis not present

## 2020-03-13 DIAGNOSIS — Z79899 Other long term (current) drug therapy: Secondary | ICD-10-CM | POA: Diagnosis not present

## 2020-03-13 DIAGNOSIS — G479 Sleep disorder, unspecified: Secondary | ICD-10-CM | POA: Diagnosis not present

## 2020-03-13 DIAGNOSIS — E559 Vitamin D deficiency, unspecified: Secondary | ICD-10-CM | POA: Diagnosis not present

## 2020-03-16 ENCOUNTER — Encounter: Payer: Self-pay | Admitting: Physical Therapy

## 2020-03-16 ENCOUNTER — Ambulatory Visit: Payer: PPO | Attending: Family Medicine | Admitting: Physical Therapy

## 2020-03-16 ENCOUNTER — Other Ambulatory Visit: Payer: Self-pay

## 2020-03-16 DIAGNOSIS — M25551 Pain in right hip: Secondary | ICD-10-CM | POA: Diagnosis not present

## 2020-03-16 DIAGNOSIS — R252 Cramp and spasm: Secondary | ICD-10-CM | POA: Insufficient documentation

## 2020-03-16 DIAGNOSIS — R262 Difficulty in walking, not elsewhere classified: Secondary | ICD-10-CM | POA: Insufficient documentation

## 2020-03-16 NOTE — Therapy (Signed)
[facility] [address] [place], [place], [address] Phone: [phone]   Fax:  [phone]  Physical Therapy Evaluation  Patient Details  Name: Craig Cooper MRN: [id number] Date of Birth: 21-Jun-1950 Referring Provider (PT): Corliss Blacker   Encounter Date: 03/16/2020   PT End of Session - 03/16/20 0906    Visit Number 1    Date for PT Re-Evaluation 05/17/20    PT Start Time 0825    PT Stop Time 0910    PT Time Calculation (min) 45 min    Activity Tolerance Patient tolerated treatment well    Behavior During Therapy [facility] for tasks assessed/performed           Past Medical History:  Diagnosis Date  . Angio-edema   . GERD (gastroesophageal reflux disease)   . Hx of colonic polyp 08/31/2010  . Hypertension   . Pinched nerve in neck    FOLLOWED BY DR. Berline Chough  . TIA (transient ischemic attack) 09/2003  . Vitamin D deficiency     Past Surgical History:  Procedure Laterality Date  . BACK SURGERY  2000, 1995  . MOUTH SURGERY  1980  . SPINE SURGERY  1992/1998  . TONSILLECTOMY    . WISDOM TOOTH EXTRACTION      There were no vitals filed for this visit.    Subjective Assessment - 03/16/20 0826    Subjective Patient reports right hip pain for several months, reports that pain is up and down, unsure of a specific cause, reports very difficult going up stairs.    Limitations Standing;Walking;House hold activities    How long can you stand comfortably? pain at times    How long can you walk comfortably? pain at times    Currently in Pain? Yes    Pain Score 1     Pain Location Hip    Pain Orientation Right    Pain Descriptors / Indicators Sharp    Pain Type Acute pain    Pain Radiating Towards has some numbness in the legs below the knees reports that has been like that    Pain Onset More than a month ago    Pain Frequency Intermittent    Aggravating Factors  lying on the right side, first few steps after sitting,  going up stairs.  Pain up to 8/10    Pain Relieving Factors rest, nothing in particular, pain can be 0/10    Effect of Pain on Daily Activities reports difficulty sleeping and going up stairs              [facility] PT Assessment - 03/16/20 0001      Assessment   Medical Diagnosis right GT bursitis    Referring Provider (PT) Corliss Blacker    Onset Date/Surgical Date 02/15/20    Prior Therapy no      Precautions   Precautions None      Balance Screen   Has the patient fallen in the past 6 months No    Has the patient had a decrease in activity level because of a fear of falling?  No    Is the patient reluctant to leave their home because of a fear of falling?  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 [employer]  was walking 1 mile a day      Posture/Postural Control   Posture Comments fwd head, rounded shoulders      ROM / Strength   AROM / PROM / Strength AROM;Strength      AROM   Overall AROM Comments lumbar ROM decreased 50% with c/o tightness and stiffness in the back      Strength   Overall Strength Comments hips 4/5 slight increase of pain      Flexibility   Soft Tissue Assessment /Muscle Length yes    Hamstrings tight    ITB very tight    Piriformis tight      Ambulation/Gait   Gait Comments mild antalgic gait, has the right foot that really rolls in                      Objective measurements completed on examination: See above findings.       [facility] Adult PT Treatment/Exercise - 03/16/20 0001      Modalities   Modalities Iontophoresis      Iontophoresis   Type of Iontophoresis Dexamethasone    Location right GT area    Dose 41mA    Time 4 hour patch                    PT Short Term Goals - 03/16/20 0911      PT SHORT TERM GOAL #1   Title independent with initial  HEP    Time 2    Period Weeks    Status New             PT Long Term Goals - 03/16/20 0911      PT LONG TERM GOAL #1   Title understand posture and body mechanics    Time 8    Period Weeks    Status New      PT LONG TERM GOAL #2   Title decrease pain overall 50%    Time 8    Period Weeks    Status New      PT LONG TERM GOAL #3   Title go up stairs step over step wtih minimal difficulty    Time 8    Period Weeks    Status New      PT LONG TERM GOAL #4   Title return to walking daily    Time 8    Period Weeks    Status New                  Plan - 03/16/20 0908    Clinical Impression Statement Patient reports right hip pain for about two months, unsure of a cause, has difficulty going up stairs, lying on the right side and walking after sitting, reports a sharp pain in the right lateral hip up to 8/10.  Reports pain is inconsistent.  He has right foot that tolls in with gait, has some limitation in lumbar ROM, has past lumbar surgery x 2.  His right hip mms are tight.  Tender over the right GT area    Stability/Clinical Decision Making Stable/Uncomplicated    Clinical Decision Making Low    Rehab Potential Good    PT Duration 8 weeks    PT Treatment/Interventions ADLs/Self Care Home Management;Cryotherapy;Electrical Stimulation;Iontophoresis 4mg /ml Dexamethasone;Moist Heat;Ultrasound;Gait training;Stair training;Functional mobility training;Therapeutic activities;Therapeutic exercise;Balance training;Manual techniques;Patient/family education    PT Next Visit Plan start gym activities, LE flexibility, ionto    Consulted and Agree with Plan  of Care Patient           Patient will benefit from skilled therapeutic intervention in order to improve the following deficits and impairments:  Abnormal gait,Decreased range of motion,Difficulty walking,Increased muscle spasms,Pain,Impaired flexibility,Decreased strength  Visit Diagnosis: Pain in right hip - Plan: PT  plan of care cert/re-cert  Cramp and spasm - Plan: PT plan of care cert/re-cert  Difficulty in walking, not elsewhere classified - Plan: PT plan of care cert/re-cert     Problem List Patient Active Problem List   Diagnosis Date Noted  . Osteoarthritis of spine with radiculopathy, cervical region 02/12/2018  . Angioedema 10/24/2017  . Rhinitis 10/24/2017    Jearld Lesch., PT 03/16/2020, 9:15 AM  [facility] [address] [place], [place], [address] Phone: [phone]   Fax:  [phone]  Name: Craig Cooper MRN: [id number] Date of Birth: 11-Sep-1950

## 2020-03-16 NOTE — Patient Instructions (Addendum)
Access Code: [email or web address] URL: [email or web address] Date: 03/16/2020 Prepared by: Stacie Glaze  Exercises Supine ITB Stretch with Strap - 2 x daily - 7 x weekly - 1 sets - 5 reps - 30 hold Supine Piriformis Stretch Pulling Heel to Hip - 2 x daily - 7 x weekly - 1 sets - 5 reps - 30 hold Seated Hamstring Stretch with Chair - 2 x daily - 7 x weekly - 1 sets - 5 reps - 30 hold   IONTOPHORESIS PATIENT PRECAUTIONS & CONTRAINDICATIONS:   Redness under one or both electrodes can occur.  This characterized by a uniform redness that usually disappears within 12 hours of treatment.  Small pinhead size blisters may result in response to the drug.  Contact your physician if the problem persists more than 24 hours.  On rare occasions, iontophoresis therapy can result in temporary skin reactions such as rash, inflammation, irritation or burns.  The skin reactions may be the result of individual sensitivity to the ionic solution used, the condition of the skin at the start of treatment, reaction to the materials in the electrodes, allergies or sensitivity to dexamethasone, or a poor connection between the patch and your skin.  Discontinue using iontophoresis if you have any of these reactions and report to your therapist.  Remove the Patch or electrodes if you have any undue sensation of pain or burning during the treatment and report discomfort to your therapist.  Tell your Therapist if you have had known adverse reactions to the application of electrical current.  If using the Patch, the LED light will turn off when treatment is complete and the patch can be removed.  Approximate treatment time is 1-3 hours.  Remove the patch when light goes off or after 6 hours.  The Patch can be worn during normal activity, however excessive motion where the electrodes have been placed can cause poor contact between the skin and the electrode or uneven electrical current resulting in greater risk of  skin irritation.  Keep out of the reach of children.    DO NOT use if you have a cardiac pacemaker or any other electrically sensitive implanted device.  DO NOT use if you have a known sensitivity to dexamethasone.  DO NOT use during Magnetic Resonance Imaging (MRI).  DO NOT use over broken or compromised skin (e.g. sunburn, cuts, or acne) due to the increased risk of skin reaction.  DO NOT SHAVE over the area to be treated:  To establish good contact between the Patch and the skin, excessive hair may be clipped.  DO NOT place the Patch or electrodes on or over your eyes, directly over your heart, or brain.  DO NOT reuse the Patch or electrodes as this may cause burns to occur.

## 2020-03-18 ENCOUNTER — Encounter: Payer: Self-pay | Admitting: Physical Therapy

## 2020-03-18 ENCOUNTER — Other Ambulatory Visit: Payer: Self-pay

## 2020-03-18 ENCOUNTER — Ambulatory Visit: Payer: PPO | Admitting: Physical Therapy

## 2020-03-18 DIAGNOSIS — M25551 Pain in right hip: Secondary | ICD-10-CM | POA: Diagnosis not present

## 2020-03-18 DIAGNOSIS — R252 Cramp and spasm: Secondary | ICD-10-CM

## 2020-03-18 DIAGNOSIS — R262 Difficulty in walking, not elsewhere classified: Secondary | ICD-10-CM

## 2020-03-18 NOTE — Therapy (Signed)
[facility] [place] [facility] [address] [place], [place], [address] Phone: [phone]   Fax:  [phone]  Physical Therapy Treatment  Patient Details  Name: Craig Cooper MRN: [id number] Date of Birth: 01-03-1951 Referring Provider (PT): Corliss Blacker   Encounter Date: 03/18/2020   PT End of Session - 03/18/20 1734    Visit Number 2    Date for PT Re-Evaluation 05/17/20    PT Start Time 1649    PT Stop Time 1734    PT Time Calculation (min) 45 min    Activity Tolerance Patient tolerated treatment well    Behavior During Therapy [facility] for tasks assessed/performed           Past Medical History:  Diagnosis Date  . Angio-edema   . GERD (gastroesophageal reflux disease)   . Hx of colonic polyp 08/31/2010  . Hypertension   . Pinched nerve in neck    FOLLOWED BY DR. Berline Chough  . TIA (transient ischemic attack) 09/2003  . Vitamin D deficiency     Past Surgical History:  Procedure Laterality Date  . BACK SURGERY  2000, 1995  . MOUTH SURGERY  1980  . SPINE SURGERY  1992/1998  . TONSILLECTOMY    . WISDOM TOOTH EXTRACTION      There were no vitals filed for this visit.   Subjective Assessment - 03/18/20 1648    Subjective Pt reports doing well today; hip is feeling a little better, states stretches are helping.    Currently in Pain? Yes    Pain Score 1     Pain Location Hip    Pain Orientation Right                             [facility] Adult PT Treatment/Exercise - 03/18/20 0001      Exercises   Exercises Knee/Hip      Knee/Hip Exercises: Stretches   Passive Hamstring Stretch Right;1 rep;3 reps;20 seconds    ITB Stretch Right;3 reps    Piriformis Stretch Right;3 reps;20 seconds    Gastroc Stretch Both;1 rep;30 seconds      Knee/Hip Exercises: Aerobic   Nustep L5 x 6 min      Knee/Hip Exercises: Machines for Strengthening   Cybex Knee Extension 10# 2x10    Cybex Knee Flexion 35# 2x10    Cybex Leg Press 40#  3x10 feet 3 ways      Knee/Hip Exercises: Standing   Heel Raises Both;1 set;10 reps    Lateral Step Up Both;1 set;10 reps;Hand Hold: 0;Step Height: 6"    Other Standing Knee Exercises eccentric heel taps 4" stair x10 B    Other Standing Knee Exercises resisted gait x4 each direction 30#      Knee/Hip Exercises: Supine   Bridges with Clamshell Both;1 set;10 reps   green tb   Straight Leg Raises Both;1 set;10 reps    Straight Leg Raises Limitations with hip abduction    Other Supine Knee/Hip Exercises bent knee fallouts with green TB x10 B      Iontophoresis   Type of Iontophoresis Dexamethasone    Location right GT area    Dose 74mA    Time 4 hour patch      Manual Therapy   Manual Therapy Passive ROM    Passive ROM PROM to R hip all directions  PT Short Term Goals - 03/18/20 1736      PT SHORT TERM GOAL #1   Title independent with initial HEP    Time 2    Period Weeks    Status Achieved             PT Long Term Goals - 03/16/20 0911      PT LONG TERM GOAL #1   Title understand posture and body mechanics    Time 8    Period Weeks    Status New      PT LONG TERM GOAL #2   Title decrease pain overall 50%    Time 8    Period Weeks    Status New      PT LONG TERM GOAL #3   Title go up stairs step over step wtih minimal difficulty    Time 8    Period Weeks    Status New      PT LONG TERM GOAL #4   Title return to walking daily    Time 8    Period Weeks    Status New                 Plan - 03/18/20 1734    Clinical Impression Statement Pt tolerated progression to TE well with no c/o of increased pain in R hip with exercise. Pt demos increased instability in SLS on RLE. Pt able to tolerate all machine interventions but notes that R hip fatigues more quickly than L hip. R hip tight esp in IR/ER, cues for relaxation with manual tx. Pt reports pain relief with ionto, requests ionto patch this rx.    PT  Treatment/Interventions ADLs/Self Care Home Management;Cryotherapy;Electrical Stimulation;Iontophoresis 4mg /ml Dexamethasone;Moist Heat;Ultrasound;Gait training;Stair training;Functional mobility training;Therapeutic activities;Therapeutic exercise;Balance training;Manual techniques;Patient/family education    PT Next Visit Plan start gym activities, LE flexibility, ionto    Consulted and Agree with Plan of Care Patient           Patient will benefit from skilled therapeutic intervention in order to improve the following deficits and impairments:  Abnormal gait,Decreased range of motion,Difficulty walking,Increased muscle spasms,Pain,Impaired flexibility,Decreased strength  Visit Diagnosis: Pain in right hip  Cramp and spasm  Difficulty in walking, not elsewhere classified     Problem List Patient Active Problem List   Diagnosis Date Noted  . Osteoarthritis of spine with radiculopathy, cervical region 02/12/2018  . Angioedema 10/24/2017  . Rhinitis 10/24/2017   10/26/2017, PT, DPT Lysle Rubens  03/18/2020, 5:38 PM  [facility] [place] [facility] [address] [place], [place], [place] Phone: [phone]   Fax:  [phone]  Name: Craig Cooper MRN: Lorra Hals Date of Birth: [birth year]

## 2020-03-23 ENCOUNTER — Ambulatory Visit: Payer: PPO | Admitting: Physical Therapy

## 2020-03-23 ENCOUNTER — Other Ambulatory Visit: Payer: Self-pay

## 2020-03-23 ENCOUNTER — Encounter: Payer: Self-pay | Admitting: Physical Therapy

## 2020-03-23 DIAGNOSIS — N400 Enlarged prostate without lower urinary tract symptoms: Secondary | ICD-10-CM | POA: Diagnosis not present

## 2020-03-23 DIAGNOSIS — J4520 Mild intermittent asthma, uncomplicated: Secondary | ICD-10-CM | POA: Diagnosis not present

## 2020-03-23 DIAGNOSIS — K219 Gastro-esophageal reflux disease without esophagitis: Secondary | ICD-10-CM | POA: Diagnosis not present

## 2020-03-23 DIAGNOSIS — I10 Essential (primary) hypertension: Secondary | ICD-10-CM | POA: Diagnosis not present

## 2020-03-23 DIAGNOSIS — E782 Mixed hyperlipidemia: Secondary | ICD-10-CM | POA: Diagnosis not present

## 2020-03-23 DIAGNOSIS — R262 Difficulty in walking, not elsewhere classified: Secondary | ICD-10-CM

## 2020-03-23 DIAGNOSIS — M25551 Pain in right hip: Secondary | ICD-10-CM | POA: Diagnosis not present

## 2020-03-23 DIAGNOSIS — M158 Other polyosteoarthritis: Secondary | ICD-10-CM | POA: Diagnosis not present

## 2020-03-23 DIAGNOSIS — R252 Cramp and spasm: Secondary | ICD-10-CM

## 2020-03-23 NOTE — Therapy (Signed)
[facility] [place] [facility] [address] [place], [place], [address] Phone: [phone]   Fax:  [phone]  Physical Therapy Treatment  Patient Details  Name: Craig Cooper MRN: [id number] Date of Birth: 1951-03-12 Referring Provider (PT): Corliss Blacker   Encounter Date: 03/23/2020   PT End of Session - 03/23/20 0839    Visit Number 3    Date for PT Re-Evaluation 05/17/20    PT Start Time 0800    PT Stop Time 0840    PT Time Calculation (min) 40 min    Activity Tolerance Patient tolerated treatment well    Behavior During Therapy [facility] for tasks assessed/performed           Past Medical History:  Diagnosis Date  . Angio-edema   . GERD (gastroesophageal reflux disease)   . Hx of colonic polyp 08/31/2010  . Hypertension   . Pinched nerve in neck    FOLLOWED BY DR. Berline Chough  . TIA (transient ischemic attack) 09/2003  . Vitamin D deficiency     Past Surgical History:  Procedure Laterality Date  . BACK SURGERY  2000, 1995  . MOUTH SURGERY  1980  . SPINE SURGERY  1992/1998  . TONSILLECTOMY    . WISDOM TOOTH EXTRACTION      There were no vitals filed for this visit.   Subjective Assessment - 03/23/20 0802    Subjective Pt reports hip is bothering him a little more this morning, feels stiff    Currently in Pain? Yes    Pain Score 5     Pain Location Hip    Pain Orientation Right                             [facility] Adult PT Treatment/Exercise - 03/23/20 0001      Knee/Hip Exercises: Aerobic   Nustep L5 x 6 min      Knee/Hip Exercises: Machines for Strengthening   Cybex Knee Extension 10# 2x10    Cybex Knee Flexion 35# 2x10    Cybex Leg Press 60# 3x10 feet 3 ways    Other Machine 5# hip abd/ext BLE 2x10      Knee/Hip Exercises: Standing   Heel Raises Both;1 set;10 reps    Other Standing Knee Exercises resisted gait x4 each direction 30#      Iontophoresis   Type of Iontophoresis Dexamethasone    Location  right GT area    Dose 59mA    Time 4 hour patch                    PT Short Term Goals - 03/18/20 1736      PT SHORT TERM GOAL #1   Title independent with initial HEP    Time 2    Period Weeks    Status Achieved             PT Long Term Goals - 03/16/20 8338      PT LONG TERM GOAL #1   Title understand posture and body mechanics    Time 8    Period Weeks    Status New      PT LONG TERM GOAL #2   Title decrease pain overall 50%    Time 8    Period Weeks    Status New      PT LONG TERM GOAL #3   Title go up stairs step over step wtih minimal difficulty  Time 8    Period Weeks    Status New      PT LONG TERM GOAL #4   Title return to walking daily    Time 8    Period Weeks    Status New                 Plan - 03/23/20 0840    Clinical Impression Statement Pt tolerated progression of machine interventions well with no reports of increase in R hip pain with exercise. Did report excessive fatigue of B hip with standing hip abd/ext. Cuing to avoid compensations with hip abd/ext. Pt requests ionto for pain relief, reports it has been helpful.    PT Treatment/Interventions ADLs/Self Care Home Management;Cryotherapy;Electrical Stimulation;Iontophoresis 4mg /ml Dexamethasone;Moist Heat;Ultrasound;Gait training;Stair training;Functional mobility training;Therapeutic activities;Therapeutic exercise;Balance training;Manual techniques;Patient/family education    PT Next Visit Plan start gym activities, LE flexibility, ionto    Consulted and Agree with Plan of Care Patient           Patient will benefit from skilled therapeutic intervention in order to improve the following deficits and impairments:  Abnormal gait,Decreased range of motion,Difficulty walking,Increased muscle spasms,Pain,Impaired flexibility,Decreased strength  Visit Diagnosis: Pain in right hip  Cramp and spasm  Difficulty in walking, not elsewhere classified     Problem  List Patient Active Problem List   Diagnosis Date Noted  . Osteoarthritis of spine with radiculopathy, cervical region 02/12/2018  . Angioedema 10/24/2017  . Rhinitis 10/24/2017   10/26/2017, PT, DPT Lysle Rubens  03/23/2020, 8:42 AM  [facility] [address] [place], [place], [place] Phone: [phone]   Fax:  [phone]  Name: Craig Cooper MRN: Lorra Hals Date of Birth: [birth year]

## 2020-03-25 ENCOUNTER — Encounter: Payer: Self-pay | Admitting: Physical Therapy

## 2020-03-25 ENCOUNTER — Other Ambulatory Visit: Payer: Self-pay

## 2020-03-25 ENCOUNTER — Ambulatory Visit: Payer: PPO | Admitting: Physical Therapy

## 2020-03-25 DIAGNOSIS — M25551 Pain in right hip: Secondary | ICD-10-CM

## 2020-03-25 DIAGNOSIS — R262 Difficulty in walking, not elsewhere classified: Secondary | ICD-10-CM

## 2020-03-25 DIAGNOSIS — R252 Cramp and spasm: Secondary | ICD-10-CM

## 2020-03-25 NOTE — Therapy (Signed)
[facility] [place] [facility] [address] [place], [place], [address] Phone: [phone]   Fax:  [phone]  Physical Therapy Treatment  Patient Details  Name: Craig Cooper MRN: [id number] Date of Birth: 01-23-1951 Referring Provider (PT): Corliss Blacker   Encounter Date: 03/25/2020   PT End of Session - 03/25/20 0923    Visit Number 4    Date for PT Re-Evaluation 05/17/20    PT Start Time 0755    PT Stop Time 0840    PT Time Calculation (min) 45 min    Activity Tolerance Patient tolerated treatment well    Behavior During Therapy [facility] for tasks assessed/performed           Past Medical History:  Diagnosis Date   Angio-edema    GERD (gastroesophageal reflux disease)    Hx of colonic polyp 08/31/2010   Hypertension    Pinched nerve in neck    FOLLOWED BY DR. Berline Chough   TIA (transient ischemic attack) 09/2003   Vitamin D deficiency     Past Surgical History:  Procedure Laterality Date   BACK SURGERY  2000, 1995   MOUTH SURGERY  1980   SPINE SURGERY  1992/1998   TONSILLECTOMY     WISDOM TOOTH EXTRACTION      There were no vitals filed for this visit.   Subjective Assessment - 03/25/20 0815    Subjective Feeling better    Currently in Pain? Yes    Pain Score 3     Pain Location Hip    Pain Orientation Right    Aggravating Factors  stairs                             [facility] Adult PT Treatment/Exercise - 03/25/20 0001      Knee/Hip Exercises: Stretches   Passive Hamstring Stretch Right;3 reps;20 seconds    ITB Stretch Right;3 reps;20 seconds    Piriformis Stretch Right;3 reps;20 seconds      Knee/Hip Exercises: Aerobic   Recumbent Bike level 3 x 6 minutes      Knee/Hip Exercises: Machines for Strengthening   Cybex Knee Extension 15# 2x10    Cybex Knee Flexion 35# 2x10    Cybex Leg Press 60# 3x10 feet 3 ways    Other Machine 5# hip abd/ext BLE 2x10      Knee/Hip Exercises: Standing    Walking with Sports Cord all directsion      Knee/Hip Exercises: Supine   Other Supine Knee/Hip Exercises feet on ball K2C, tunk rotation, small bridges and isometric abs      Iontophoresis   Type of Iontophoresis Dexamethasone    Location right GT area    Dose 64mA    Time 4 hour patch #4                    PT Short Term Goals - 03/18/20 1736      PT SHORT TERM GOAL #1   Title independent with initial HEP    Time 2    Period Weeks    Status Achieved             PT Long Term Goals - 03/25/20 6222      PT LONG TERM GOAL #1   Title understand posture and body mechanics    Status On-going      PT LONG TERM GOAL #2   Title decrease pain overall 50%  Status On-going      PT LONG TERM GOAL #3   Title go up stairs step over step wtih minimal difficulty    Status On-going      PT LONG TERM GOAL #4   Title return to walking daily    Status On-going                 Plan - 03/25/20 0924    Clinical Impression Statement Reports that he is feeling stronger, having less pain, has difficulty with the resisted hip exercises.  Started some core activation.  He is still tight and needs cues to relax for the stretching    PT Next Visit Plan continue to progress as tolerated    Consulted and Agree with Plan of Care Patient           Patient will benefit from skilled therapeutic intervention in order to improve the following deficits and impairments:  Abnormal gait,Decreased range of motion,Difficulty walking,Increased muscle spasms,Pain,Impaired flexibility,Decreased strength  Visit Diagnosis: Pain in right hip  Cramp and spasm  Difficulty in walking, not elsewhere classified     Problem List Patient Active Problem List   Diagnosis Date Noted   Osteoarthritis of spine with radiculopathy, cervical region 02/12/2018   Angioedema 10/24/2017   Rhinitis 10/24/2017    Jearld Lesch., PT 03/25/2020, 9:27 AM  [facility] [place] [facility] [address] [place], [place], [address] Phone: [phone]   Fax:  [phone]  Name: Craig Cooper MRN: [id number] Date of Birth: 1950-09-18

## 2020-05-04 DIAGNOSIS — Z125 Encounter for screening for malignant neoplasm of prostate: Secondary | ICD-10-CM | POA: Diagnosis not present

## 2020-05-04 DIAGNOSIS — R3912 Poor urinary stream: Secondary | ICD-10-CM | POA: Diagnosis not present

## 2020-05-04 DIAGNOSIS — R972 Elevated prostate specific antigen [PSA]: Secondary | ICD-10-CM | POA: Diagnosis not present

## 2020-05-04 DIAGNOSIS — N401 Enlarged prostate with lower urinary tract symptoms: Secondary | ICD-10-CM | POA: Diagnosis not present

## 2020-07-08 IMAGING — DX DG CERVICAL SPINE 2 OR 3 VIEWS
3 series · 3 of 3 positions shown · non-contrast
Comparison: None.

CLINICAL DATA: Right scapular pain

EXAM:
CERVICAL SPINE - 2-3 VIEW

[cervical spine lat (1 of 2)]
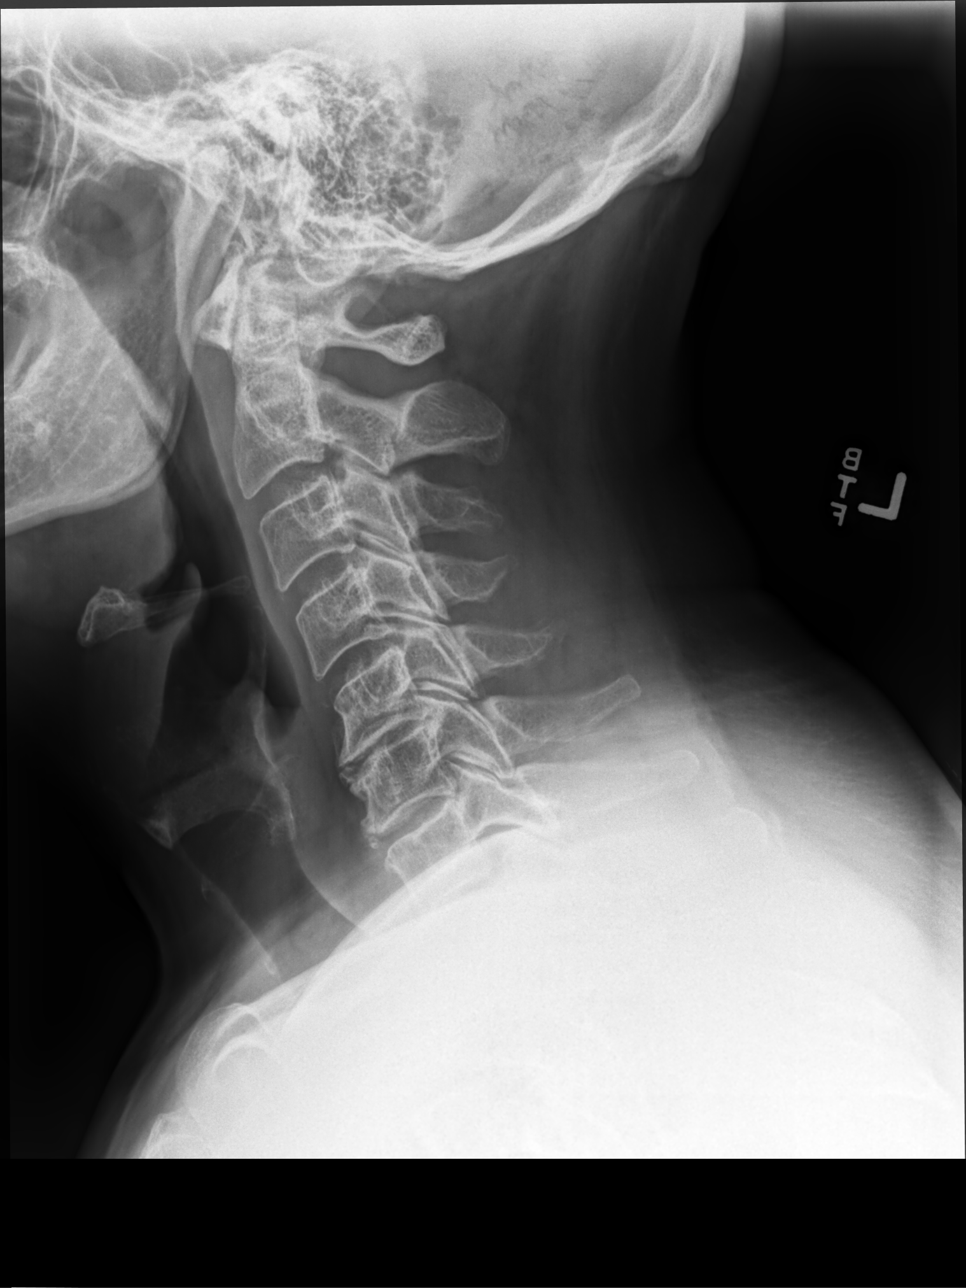

[cervical spine ap]
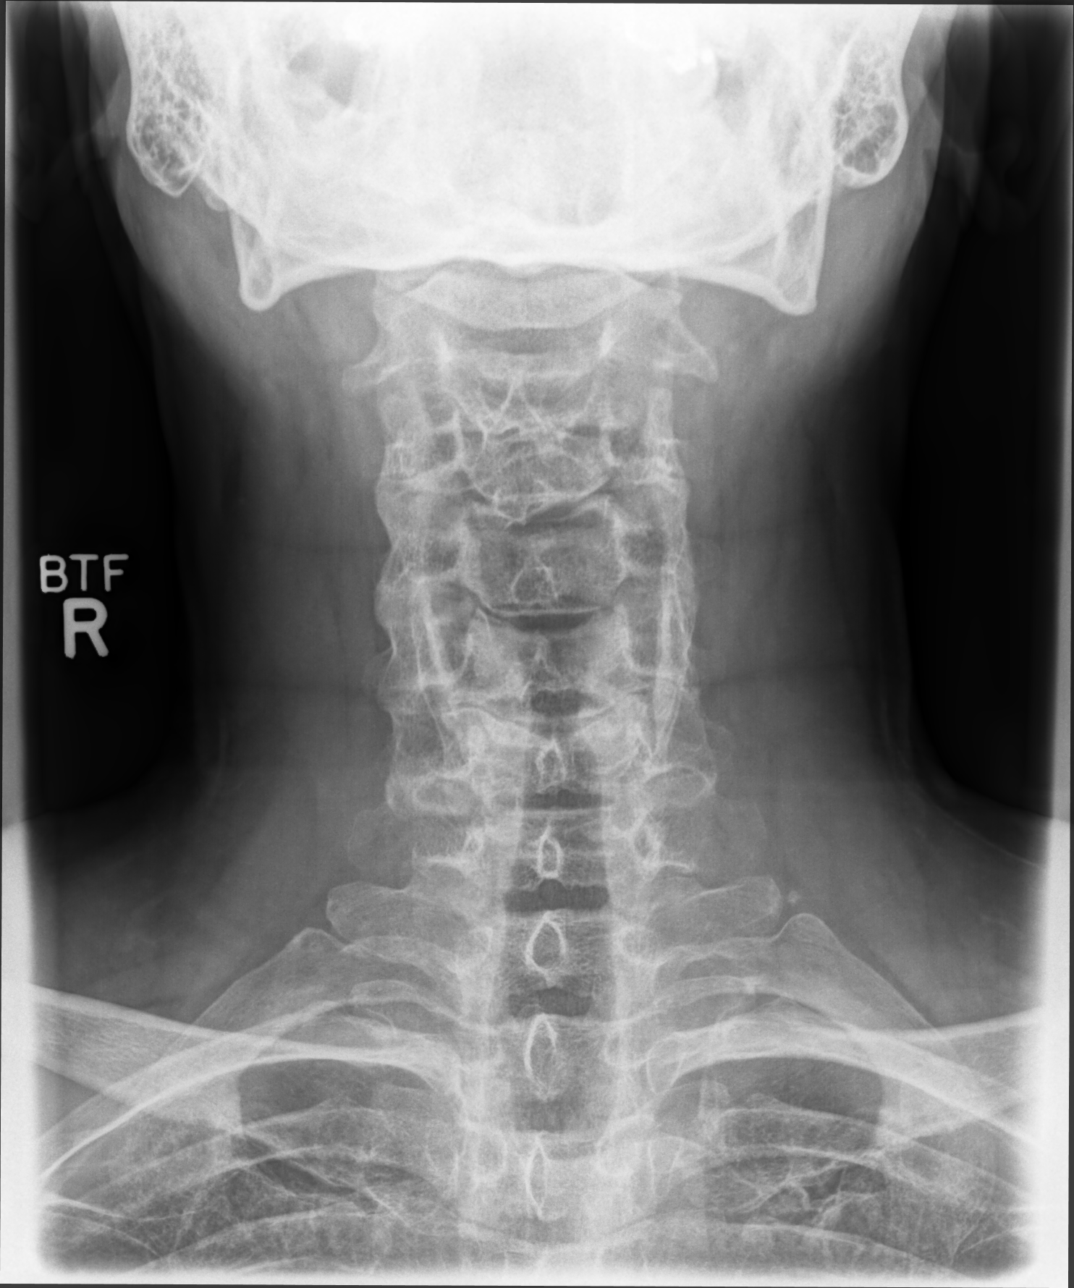

[cervical spine lat (2 of 2)]
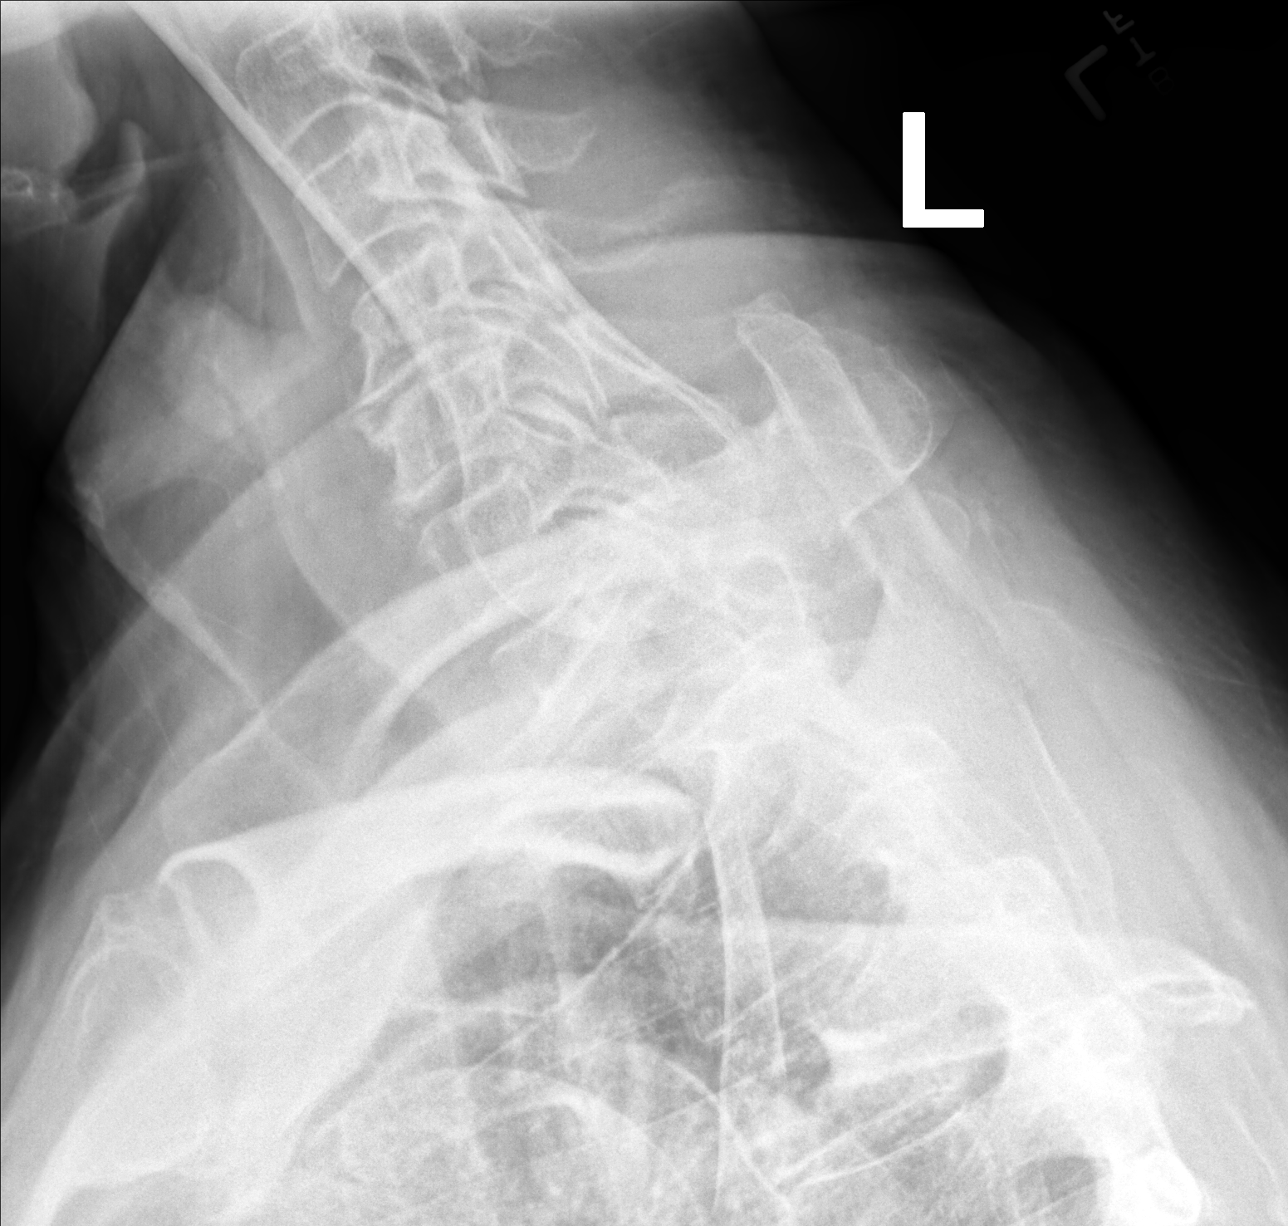

[3 of 3 positions shown; findings below may reference images not displayed]

FINDINGS: Straightening of the cervical spine. Normal prevertebral soft tissue
thickness. Moderate degenerative changes at C5-C6 and C6-C7.
IMPRESSION: Straightening of cervical spine with moderate degenerative changes
at C5-C6 and C6-C7.

## 2020-08-05 DIAGNOSIS — R972 Elevated prostate specific antigen [PSA]: Secondary | ICD-10-CM | POA: Diagnosis not present

## 2020-08-12 DIAGNOSIS — R3912 Poor urinary stream: Secondary | ICD-10-CM | POA: Diagnosis not present

## 2020-08-12 DIAGNOSIS — R972 Elevated prostate specific antigen [PSA]: Secondary | ICD-10-CM | POA: Diagnosis not present

## 2020-08-12 DIAGNOSIS — N401 Enlarged prostate with lower urinary tract symptoms: Secondary | ICD-10-CM | POA: Diagnosis not present

## 2020-09-14 DIAGNOSIS — E559 Vitamin D deficiency, unspecified: Secondary | ICD-10-CM | POA: Diagnosis not present

## 2020-09-14 DIAGNOSIS — N400 Enlarged prostate without lower urinary tract symptoms: Secondary | ICD-10-CM | POA: Diagnosis not present

## 2020-09-14 DIAGNOSIS — F419 Anxiety disorder, unspecified: Secondary | ICD-10-CM | POA: Diagnosis not present

## 2020-09-14 DIAGNOSIS — F5101 Primary insomnia: Secondary | ICD-10-CM | POA: Diagnosis not present

## 2020-09-14 DIAGNOSIS — I10 Essential (primary) hypertension: Secondary | ICD-10-CM | POA: Diagnosis not present

## 2020-09-14 DIAGNOSIS — Z79899 Other long term (current) drug therapy: Secondary | ICD-10-CM | POA: Diagnosis not present

## 2020-09-14 DIAGNOSIS — J45909 Unspecified asthma, uncomplicated: Secondary | ICD-10-CM | POA: Diagnosis not present

## 2020-09-14 DIAGNOSIS — T783XXD Angioneurotic edema, subsequent encounter: Secondary | ICD-10-CM | POA: Diagnosis not present

## 2020-09-14 DIAGNOSIS — E782 Mixed hyperlipidemia: Secondary | ICD-10-CM | POA: Diagnosis not present

## 2020-10-13 DIAGNOSIS — J45909 Unspecified asthma, uncomplicated: Secondary | ICD-10-CM | POA: Diagnosis not present

## 2020-10-13 DIAGNOSIS — U071 COVID-19: Secondary | ICD-10-CM | POA: Diagnosis not present

## 2020-11-25 DIAGNOSIS — M542 Cervicalgia: Secondary | ICD-10-CM | POA: Diagnosis not present

## 2020-12-01 DIAGNOSIS — R972 Elevated prostate specific antigen [PSA]: Secondary | ICD-10-CM | POA: Diagnosis not present

## 2020-12-07 DIAGNOSIS — R972 Elevated prostate specific antigen [PSA]: Secondary | ICD-10-CM | POA: Diagnosis not present

## 2020-12-07 DIAGNOSIS — R3912 Poor urinary stream: Secondary | ICD-10-CM | POA: Diagnosis not present

## 2020-12-07 DIAGNOSIS — N401 Enlarged prostate with lower urinary tract symptoms: Secondary | ICD-10-CM | POA: Diagnosis not present

## 2021-01-29 DIAGNOSIS — K219 Gastro-esophageal reflux disease without esophagitis: Secondary | ICD-10-CM | POA: Diagnosis not present

## 2021-01-29 DIAGNOSIS — J4520 Mild intermittent asthma, uncomplicated: Secondary | ICD-10-CM | POA: Diagnosis not present

## 2021-03-16 DIAGNOSIS — Z0000 Encounter for general adult medical examination without abnormal findings: Secondary | ICD-10-CM | POA: Diagnosis not present

## 2021-03-16 DIAGNOSIS — Z1389 Encounter for screening for other disorder: Secondary | ICD-10-CM | POA: Diagnosis not present

## 2021-03-18 DIAGNOSIS — E782 Mixed hyperlipidemia: Secondary | ICD-10-CM | POA: Diagnosis not present

## 2021-03-18 DIAGNOSIS — L501 Idiopathic urticaria: Secondary | ICD-10-CM | POA: Diagnosis not present

## 2021-03-18 DIAGNOSIS — I10 Essential (primary) hypertension: Secondary | ICD-10-CM | POA: Diagnosis not present

## 2021-03-18 DIAGNOSIS — F419 Anxiety disorder, unspecified: Secondary | ICD-10-CM | POA: Diagnosis not present

## 2021-03-18 DIAGNOSIS — Z79899 Other long term (current) drug therapy: Secondary | ICD-10-CM | POA: Diagnosis not present

## 2021-03-18 DIAGNOSIS — N529 Male erectile dysfunction, unspecified: Secondary | ICD-10-CM | POA: Diagnosis not present

## 2021-03-18 DIAGNOSIS — T7840XA Allergy, unspecified, initial encounter: Secondary | ICD-10-CM | POA: Diagnosis not present

## 2021-03-18 DIAGNOSIS — J45909 Unspecified asthma, uncomplicated: Secondary | ICD-10-CM | POA: Diagnosis not present

## 2021-03-18 DIAGNOSIS — E559 Vitamin D deficiency, unspecified: Secondary | ICD-10-CM | POA: Diagnosis not present

## 2021-03-18 DIAGNOSIS — K219 Gastro-esophageal reflux disease without esophagitis: Secondary | ICD-10-CM | POA: Diagnosis not present

## 2021-05-31 DIAGNOSIS — R972 Elevated prostate specific antigen [PSA]: Secondary | ICD-10-CM | POA: Diagnosis not present

## 2021-06-09 DIAGNOSIS — R972 Elevated prostate specific antigen [PSA]: Secondary | ICD-10-CM | POA: Diagnosis not present

## 2021-06-09 DIAGNOSIS — N5201 Erectile dysfunction due to arterial insufficiency: Secondary | ICD-10-CM | POA: Diagnosis not present

## 2021-06-09 DIAGNOSIS — N400 Enlarged prostate without lower urinary tract symptoms: Secondary | ICD-10-CM | POA: Diagnosis not present

## 2021-09-13 DIAGNOSIS — Z79899 Other long term (current) drug therapy: Secondary | ICD-10-CM | POA: Diagnosis not present

## 2021-09-13 DIAGNOSIS — E559 Vitamin D deficiency, unspecified: Secondary | ICD-10-CM | POA: Diagnosis not present

## 2021-09-16 DIAGNOSIS — G629 Polyneuropathy, unspecified: Secondary | ICD-10-CM | POA: Diagnosis not present

## 2021-09-16 DIAGNOSIS — F419 Anxiety disorder, unspecified: Secondary | ICD-10-CM | POA: Diagnosis not present

## 2021-09-16 DIAGNOSIS — E673 Hypervitaminosis D: Secondary | ICD-10-CM | POA: Diagnosis not present

## 2021-09-16 DIAGNOSIS — N400 Enlarged prostate without lower urinary tract symptoms: Secondary | ICD-10-CM | POA: Diagnosis not present

## 2021-09-16 DIAGNOSIS — K219 Gastro-esophageal reflux disease without esophagitis: Secondary | ICD-10-CM | POA: Diagnosis not present

## 2021-09-16 DIAGNOSIS — J45909 Unspecified asthma, uncomplicated: Secondary | ICD-10-CM | POA: Diagnosis not present

## 2021-09-16 DIAGNOSIS — T7840XA Allergy, unspecified, initial encounter: Secondary | ICD-10-CM | POA: Diagnosis not present

## 2021-09-16 DIAGNOSIS — I10 Essential (primary) hypertension: Secondary | ICD-10-CM | POA: Diagnosis not present

## 2021-09-16 DIAGNOSIS — E782 Mixed hyperlipidemia: Secondary | ICD-10-CM | POA: Diagnosis not present

## 2021-11-03 DIAGNOSIS — F411 Generalized anxiety disorder: Secondary | ICD-10-CM | POA: Diagnosis not present

## 2021-11-03 DIAGNOSIS — T783XXA Angioneurotic edema, initial encounter: Secondary | ICD-10-CM | POA: Diagnosis not present

## 2021-12-07 DIAGNOSIS — F411 Generalized anxiety disorder: Secondary | ICD-10-CM | POA: Diagnosis not present

## 2021-12-07 DIAGNOSIS — T783XXA Angioneurotic edema, initial encounter: Secondary | ICD-10-CM | POA: Diagnosis not present

## 2021-12-13 DIAGNOSIS — N400 Enlarged prostate without lower urinary tract symptoms: Secondary | ICD-10-CM | POA: Diagnosis not present

## 2021-12-20 DIAGNOSIS — N5201 Erectile dysfunction due to arterial insufficiency: Secondary | ICD-10-CM | POA: Diagnosis not present

## 2021-12-20 DIAGNOSIS — N401 Enlarged prostate with lower urinary tract symptoms: Secondary | ICD-10-CM | POA: Diagnosis not present

## 2021-12-20 DIAGNOSIS — R3912 Poor urinary stream: Secondary | ICD-10-CM | POA: Diagnosis not present

## 2022-01-28 DIAGNOSIS — Z6831 Body mass index (BMI) 31.0-31.9, adult: Secondary | ICD-10-CM | POA: Diagnosis not present

## 2022-01-28 DIAGNOSIS — R053 Chronic cough: Secondary | ICD-10-CM | POA: Diagnosis not present

## 2022-01-28 DIAGNOSIS — J4520 Mild intermittent asthma, uncomplicated: Secondary | ICD-10-CM | POA: Diagnosis not present

## 2022-01-28 DIAGNOSIS — K219 Gastro-esophageal reflux disease without esophagitis: Secondary | ICD-10-CM | POA: Diagnosis not present

## 2022-03-03 DIAGNOSIS — J4520 Mild intermittent asthma, uncomplicated: Secondary | ICD-10-CM | POA: Diagnosis not present

## 2022-03-03 DIAGNOSIS — R9389 Abnormal findings on diagnostic imaging of other specified body structures: Secondary | ICD-10-CM | POA: Diagnosis not present

## 2022-03-03 DIAGNOSIS — Z6832 Body mass index (BMI) 32.0-32.9, adult: Secondary | ICD-10-CM | POA: Diagnosis not present

## 2022-03-03 DIAGNOSIS — R053 Chronic cough: Secondary | ICD-10-CM | POA: Diagnosis not present

## 2022-03-31 DIAGNOSIS — Z23 Encounter for immunization: Secondary | ICD-10-CM | POA: Diagnosis not present

## 2022-03-31 DIAGNOSIS — Z1389 Encounter for screening for other disorder: Secondary | ICD-10-CM | POA: Diagnosis not present

## 2022-03-31 DIAGNOSIS — E559 Vitamin D deficiency, unspecified: Secondary | ICD-10-CM | POA: Diagnosis not present

## 2022-03-31 DIAGNOSIS — Z6833 Body mass index (BMI) 33.0-33.9, adult: Secondary | ICD-10-CM | POA: Diagnosis not present

## 2022-03-31 DIAGNOSIS — Z0000 Encounter for general adult medical examination without abnormal findings: Secondary | ICD-10-CM | POA: Diagnosis not present

## 2022-03-31 DIAGNOSIS — E782 Mixed hyperlipidemia: Secondary | ICD-10-CM | POA: Diagnosis not present

## 2022-04-06 DIAGNOSIS — J4520 Mild intermittent asthma, uncomplicated: Secondary | ICD-10-CM | POA: Diagnosis not present

## 2022-04-06 DIAGNOSIS — E782 Mixed hyperlipidemia: Secondary | ICD-10-CM | POA: Diagnosis not present

## 2022-04-06 DIAGNOSIS — F411 Generalized anxiety disorder: Secondary | ICD-10-CM | POA: Diagnosis not present

## 2022-04-06 DIAGNOSIS — N138 Other obstructive and reflux uropathy: Secondary | ICD-10-CM | POA: Diagnosis not present

## 2022-04-06 DIAGNOSIS — Z79899 Other long term (current) drug therapy: Secondary | ICD-10-CM | POA: Diagnosis not present

## 2022-04-06 DIAGNOSIS — F5101 Primary insomnia: Secondary | ICD-10-CM | POA: Diagnosis not present

## 2022-04-06 DIAGNOSIS — I10 Essential (primary) hypertension: Secondary | ICD-10-CM | POA: Diagnosis not present

## 2022-04-06 DIAGNOSIS — Z6832 Body mass index (BMI) 32.0-32.9, adult: Secondary | ICD-10-CM | POA: Diagnosis not present

## 2022-04-06 DIAGNOSIS — K219 Gastro-esophageal reflux disease without esophagitis: Secondary | ICD-10-CM | POA: Diagnosis not present

## 2022-04-06 DIAGNOSIS — N401 Enlarged prostate with lower urinary tract symptoms: Secondary | ICD-10-CM | POA: Diagnosis not present

## 2022-04-06 DIAGNOSIS — E673 Hypervitaminosis D: Secondary | ICD-10-CM | POA: Diagnosis not present

## 2022-04-06 DIAGNOSIS — L501 Idiopathic urticaria: Secondary | ICD-10-CM | POA: Diagnosis not present

## 2022-04-13 DIAGNOSIS — I10 Essential (primary) hypertension: Secondary | ICD-10-CM | POA: Diagnosis not present

## 2022-04-13 DIAGNOSIS — Z6833 Body mass index (BMI) 33.0-33.9, adult: Secondary | ICD-10-CM | POA: Diagnosis not present

## 2022-04-18 DIAGNOSIS — R531 Weakness: Secondary | ICD-10-CM | POA: Diagnosis not present

## 2022-04-18 DIAGNOSIS — R410 Disorientation, unspecified: Secondary | ICD-10-CM | POA: Diagnosis not present

## 2022-04-27 DIAGNOSIS — J4520 Mild intermittent asthma, uncomplicated: Secondary | ICD-10-CM | POA: Diagnosis not present

## 2022-04-27 DIAGNOSIS — I10 Essential (primary) hypertension: Secondary | ICD-10-CM | POA: Diagnosis not present

## 2022-05-27 DIAGNOSIS — M5414 Radiculopathy, thoracic region: Secondary | ICD-10-CM | POA: Diagnosis not present

## 2022-05-27 DIAGNOSIS — M546 Pain in thoracic spine: Secondary | ICD-10-CM | POA: Diagnosis not present

## 2022-05-27 DIAGNOSIS — M62830 Muscle spasm of back: Secondary | ICD-10-CM | POA: Diagnosis not present

## 2022-05-27 DIAGNOSIS — M5413 Radiculopathy, cervicothoracic region: Secondary | ICD-10-CM | POA: Diagnosis not present

## 2022-05-27 DIAGNOSIS — M5415 Radiculopathy, thoracolumbar region: Secondary | ICD-10-CM | POA: Diagnosis not present

## 2022-05-27 DIAGNOSIS — M5412 Radiculopathy, cervical region: Secondary | ICD-10-CM | POA: Diagnosis not present

## 2022-05-27 DIAGNOSIS — M7912 Myalgia of auxiliary muscles, head and neck: Secondary | ICD-10-CM | POA: Diagnosis not present

## 2022-05-27 DIAGNOSIS — M5417 Radiculopathy, lumbosacral region: Secondary | ICD-10-CM | POA: Diagnosis not present

## 2022-05-30 DIAGNOSIS — M5417 Radiculopathy, lumbosacral region: Secondary | ICD-10-CM | POA: Diagnosis not present

## 2022-05-30 DIAGNOSIS — M62830 Muscle spasm of back: Secondary | ICD-10-CM | POA: Diagnosis not present

## 2022-05-30 DIAGNOSIS — M5414 Radiculopathy, thoracic region: Secondary | ICD-10-CM | POA: Diagnosis not present

## 2022-05-30 DIAGNOSIS — M5415 Radiculopathy, thoracolumbar region: Secondary | ICD-10-CM | POA: Diagnosis not present

## 2022-05-30 DIAGNOSIS — M7912 Myalgia of auxiliary muscles, head and neck: Secondary | ICD-10-CM | POA: Diagnosis not present

## 2022-05-30 DIAGNOSIS — M546 Pain in thoracic spine: Secondary | ICD-10-CM | POA: Diagnosis not present

## 2022-05-30 DIAGNOSIS — M5412 Radiculopathy, cervical region: Secondary | ICD-10-CM | POA: Diagnosis not present

## 2022-05-30 DIAGNOSIS — M5413 Radiculopathy, cervicothoracic region: Secondary | ICD-10-CM | POA: Diagnosis not present

## 2022-08-01 DIAGNOSIS — E669 Obesity, unspecified: Secondary | ICD-10-CM | POA: Diagnosis not present

## 2022-08-01 DIAGNOSIS — I10 Essential (primary) hypertension: Secondary | ICD-10-CM | POA: Diagnosis not present

## 2022-08-01 DIAGNOSIS — Z6833 Body mass index (BMI) 33.0-33.9, adult: Secondary | ICD-10-CM | POA: Diagnosis not present

## 2022-08-01 DIAGNOSIS — R220 Localized swelling, mass and lump, head: Secondary | ICD-10-CM | POA: Diagnosis not present

## 2022-10-03 DIAGNOSIS — Z79899 Other long term (current) drug therapy: Secondary | ICD-10-CM | POA: Diagnosis not present

## 2022-10-03 DIAGNOSIS — I10 Essential (primary) hypertension: Secondary | ICD-10-CM | POA: Diagnosis not present

## 2022-10-03 DIAGNOSIS — E782 Mixed hyperlipidemia: Secondary | ICD-10-CM | POA: Diagnosis not present

## 2022-10-05 DIAGNOSIS — I10 Essential (primary) hypertension: Secondary | ICD-10-CM | POA: Diagnosis not present

## 2022-10-05 DIAGNOSIS — F439 Reaction to severe stress, unspecified: Secondary | ICD-10-CM | POA: Diagnosis not present

## 2022-10-05 DIAGNOSIS — L501 Idiopathic urticaria: Secondary | ICD-10-CM | POA: Diagnosis not present

## 2022-10-05 DIAGNOSIS — J4520 Mild intermittent asthma, uncomplicated: Secondary | ICD-10-CM | POA: Diagnosis not present

## 2022-10-05 DIAGNOSIS — Z6833 Body mass index (BMI) 33.0-33.9, adult: Secondary | ICD-10-CM | POA: Diagnosis not present

## 2022-10-05 DIAGNOSIS — Z125 Encounter for screening for malignant neoplasm of prostate: Secondary | ICD-10-CM | POA: Diagnosis not present

## 2022-10-05 DIAGNOSIS — F419 Anxiety disorder, unspecified: Secondary | ICD-10-CM | POA: Diagnosis not present

## 2022-10-05 DIAGNOSIS — E782 Mixed hyperlipidemia: Secondary | ICD-10-CM | POA: Diagnosis not present

## 2022-10-05 DIAGNOSIS — N138 Other obstructive and reflux uropathy: Secondary | ICD-10-CM | POA: Diagnosis not present

## 2022-10-05 DIAGNOSIS — E6609 Other obesity due to excess calories: Secondary | ICD-10-CM | POA: Diagnosis not present

## 2022-10-05 DIAGNOSIS — K219 Gastro-esophageal reflux disease without esophagitis: Secondary | ICD-10-CM | POA: Diagnosis not present

## 2022-10-05 DIAGNOSIS — R220 Localized swelling, mass and lump, head: Secondary | ICD-10-CM | POA: Diagnosis not present

## 2022-10-26 DIAGNOSIS — F320 Major depressive disorder, single episode, mild: Secondary | ICD-10-CM | POA: Diagnosis not present

## 2023-03-08 DIAGNOSIS — Z6834 Body mass index (BMI) 34.0-34.9, adult: Secondary | ICD-10-CM | POA: Diagnosis not present

## 2023-03-08 DIAGNOSIS — R0602 Shortness of breath: Secondary | ICD-10-CM | POA: Diagnosis not present

## 2023-03-08 DIAGNOSIS — R053 Chronic cough: Secondary | ICD-10-CM | POA: Diagnosis not present

## 2023-04-27 ENCOUNTER — Other Ambulatory Visit (HOSPITAL_BASED_OUTPATIENT_CLINIC_OR_DEPARTMENT_OTHER): Payer: Self-pay | Admitting: Family Medicine

## 2023-04-27 DIAGNOSIS — E782 Mixed hyperlipidemia: Secondary | ICD-10-CM

## 2023-05-03 ENCOUNTER — Ambulatory Visit (HOSPITAL_BASED_OUTPATIENT_CLINIC_OR_DEPARTMENT_OTHER)
Admission: RE | Admit: 2023-05-03 | Discharge: 2023-05-03 | Disposition: A | Discharge status: Home / Self Care | Payer: Self-pay | Source: Ambulatory Visit | Attending: Family Medicine | Admitting: Family Medicine

## 2023-05-03 DIAGNOSIS — E782 Mixed hyperlipidemia: Secondary | ICD-10-CM | POA: Insufficient documentation

## 2023-05-24 DIAGNOSIS — R972 Elevated prostate specific antigen [PSA]: Secondary | ICD-10-CM | POA: Diagnosis not present

## 2023-05-29 ENCOUNTER — Other Ambulatory Visit: Payer: Self-pay | Admitting: Nurse Practitioner

## 2023-05-29 DIAGNOSIS — R972 Elevated prostate specific antigen [PSA]: Secondary | ICD-10-CM

## 2023-06-16 ENCOUNTER — Ambulatory Visit
Admission: RE | Admit: 2023-06-16 | Discharge: 2023-06-16 | Disposition: A | Discharge status: Home / Self Care | Source: Ambulatory Visit | Attending: Nurse Practitioner | Admitting: Nurse Practitioner

## 2023-06-16 DIAGNOSIS — R972 Elevated prostate specific antigen [PSA]: Secondary | ICD-10-CM | POA: Diagnosis not present

## 2023-06-16 MED ORDER — GADOPICLENOL 0.5 MMOL/ML IV SOLN
10.0000 mL | Freq: Once | INTRAVENOUS | Status: AC | PRN
  Administered 2023-06-16: 10 mL via INTRAVENOUS

## 2023-06-26 DIAGNOSIS — R972 Elevated prostate specific antigen [PSA]: Secondary | ICD-10-CM | POA: Diagnosis not present

## 2023-06-26 DIAGNOSIS — Z6834 Body mass index (BMI) 34.0-34.9, adult: Secondary | ICD-10-CM | POA: Diagnosis not present

## 2023-06-26 DIAGNOSIS — E782 Mixed hyperlipidemia: Secondary | ICD-10-CM | POA: Diagnosis not present

## 2023-06-26 DIAGNOSIS — G629 Polyneuropathy, unspecified: Secondary | ICD-10-CM | POA: Diagnosis not present

## 2023-06-26 DIAGNOSIS — R2231 Localized swelling, mass and lump, right upper limb: Secondary | ICD-10-CM | POA: Diagnosis not present

## 2023-06-26 DIAGNOSIS — Z79899 Other long term (current) drug therapy: Secondary | ICD-10-CM | POA: Diagnosis not present

## 2023-07-07 DIAGNOSIS — R972 Elevated prostate specific antigen [PSA]: Secondary | ICD-10-CM | POA: Diagnosis not present

## 2023-07-07 DIAGNOSIS — R3912 Poor urinary stream: Secondary | ICD-10-CM | POA: Diagnosis not present

## 2023-07-07 DIAGNOSIS — N401 Enlarged prostate with lower urinary tract symptoms: Secondary | ICD-10-CM | POA: Diagnosis not present

## 2023-10-18 DIAGNOSIS — E782 Mixed hyperlipidemia: Secondary | ICD-10-CM | POA: Diagnosis not present

## 2023-10-18 DIAGNOSIS — Z79899 Other long term (current) drug therapy: Secondary | ICD-10-CM | POA: Diagnosis not present

## 2023-10-18 DIAGNOSIS — G629 Polyneuropathy, unspecified: Secondary | ICD-10-CM | POA: Diagnosis not present

## 2023-10-25 DIAGNOSIS — E782 Mixed hyperlipidemia: Secondary | ICD-10-CM | POA: Diagnosis not present

## 2023-10-25 DIAGNOSIS — J4520 Mild intermittent asthma, uncomplicated: Secondary | ICD-10-CM | POA: Diagnosis not present

## 2023-10-25 DIAGNOSIS — Z6834 Body mass index (BMI) 34.0-34.9, adult: Secondary | ICD-10-CM | POA: Diagnosis not present

## 2023-10-25 DIAGNOSIS — N401 Enlarged prostate with lower urinary tract symptoms: Secondary | ICD-10-CM | POA: Diagnosis not present

## 2023-10-25 DIAGNOSIS — I10 Essential (primary) hypertension: Secondary | ICD-10-CM | POA: Diagnosis not present

## 2023-10-25 DIAGNOSIS — K219 Gastro-esophageal reflux disease without esophagitis: Secondary | ICD-10-CM | POA: Diagnosis not present

## 2023-10-25 DIAGNOSIS — R7303 Prediabetes: Secondary | ICD-10-CM | POA: Diagnosis not present

## 2023-10-25 DIAGNOSIS — G629 Polyneuropathy, unspecified: Secondary | ICD-10-CM | POA: Diagnosis not present

## 2023-10-25 DIAGNOSIS — L309 Dermatitis, unspecified: Secondary | ICD-10-CM | POA: Diagnosis not present

## 2023-10-25 DIAGNOSIS — E66811 Obesity, class 1: Secondary | ICD-10-CM | POA: Diagnosis not present

## 2023-10-25 DIAGNOSIS — F418 Other specified anxiety disorders: Secondary | ICD-10-CM | POA: Diagnosis not present

## 2023-10-25 DIAGNOSIS — R7989 Other specified abnormal findings of blood chemistry: Secondary | ICD-10-CM | POA: Diagnosis not present

## 2023-12-21 DIAGNOSIS — R972 Elevated prostate specific antigen [PSA]: Secondary | ICD-10-CM | POA: Diagnosis not present

## 2023-12-28 DIAGNOSIS — N401 Enlarged prostate with lower urinary tract symptoms: Secondary | ICD-10-CM | POA: Diagnosis not present

## 2023-12-28 DIAGNOSIS — R972 Elevated prostate specific antigen [PSA]: Secondary | ICD-10-CM | POA: Diagnosis not present

## 2023-12-28 DIAGNOSIS — R399 Unspecified symptoms and signs involving the genitourinary system: Secondary | ICD-10-CM | POA: Diagnosis not present

## 2024-01-09 DIAGNOSIS — Z6834 Body mass index (BMI) 34.0-34.9, adult: Secondary | ICD-10-CM | POA: Diagnosis not present

## 2024-01-09 DIAGNOSIS — J45901 Unspecified asthma with (acute) exacerbation: Secondary | ICD-10-CM | POA: Diagnosis not present

## 2024-08-07 ENCOUNTER — Ambulatory Visit: Admitting: Diagnostic Neuroimaging
